# Patient Record
Sex: Male | Born: 1970
Health system: Southern US, Community
[De-identification: ages and names within clinical notes are randomized; demographics above are authoritative.]

## PROBLEM LIST (undated history)

## (undated) DIAGNOSIS — I2510 Atherosclerotic heart disease of native coronary artery without angina pectoris: Secondary | ICD-10-CM

## (undated) DIAGNOSIS — R7303 Prediabetes: Secondary | ICD-10-CM

## (undated) DIAGNOSIS — R454 Irritability and anger: Secondary | ICD-10-CM

---
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
## 2005-06-25 ENCOUNTER — Inpatient Hospital Stay: Payer: Self-pay | Admitting: Unknown Physician Specialty

## 2018-11-07 ENCOUNTER — Emergency Department
Admission: EM | Admit: 2018-11-07 | Discharge: 2018-11-10 | Disposition: A | Discharge status: Home / Self Care | Payer: Self-pay | Attending: Emergency Medicine | Admitting: Emergency Medicine

## 2018-11-07 ENCOUNTER — Encounter: Payer: Self-pay | Admitting: Emergency Medicine

## 2018-11-07 ENCOUNTER — Other Ambulatory Visit: Payer: Self-pay

## 2018-11-07 DIAGNOSIS — F39 Unspecified mood [affective] disorder: Secondary | ICD-10-CM | POA: Insufficient documentation

## 2018-11-07 DIAGNOSIS — F4325 Adjustment disorder with mixed disturbance of emotions and conduct: Secondary | ICD-10-CM

## 2018-11-07 DIAGNOSIS — R45850 Homicidal ideations: Secondary | ICD-10-CM

## 2018-11-07 DIAGNOSIS — F71 Moderate intellectual disabilities: Secondary | ICD-10-CM

## 2018-11-07 DIAGNOSIS — R451 Restlessness and agitation: Secondary | ICD-10-CM | POA: Insufficient documentation

## 2018-11-07 DIAGNOSIS — F79 Unspecified intellectual disabilities: Secondary | ICD-10-CM

## 2018-11-07 HISTORY — DX: Irritability and anger: R45.4

## 2018-11-07 HISTORY — DX: Prediabetes: R73.03

## 2018-11-07 LAB — CBC
HCT: 46 % (ref 39.0–52.0)
Hemoglobin: 15 g/dL (ref 13.0–17.0)
MCH: 30.5 pg (ref 26.0–34.0)
MCHC: 32.6 g/dL (ref 30.0–36.0)
MCV: 93.7 fL (ref 80.0–100.0)
PLATELETS: 326 10*3/uL (ref 150–400)
RBC: 4.91 MIL/uL (ref 4.22–5.81)
RDW: 12.7 % (ref 11.5–15.5)
WBC: 5.5 10*3/uL (ref 4.0–10.5)
nRBC: 0 % (ref 0.0–0.2)

## 2018-11-07 LAB — COMPREHENSIVE METABOLIC PANEL
ALT: 18 U/L (ref 0–44)
AST: 27 U/L (ref 15–41)
Albumin: 4.5 g/dL (ref 3.5–5.0)
Alkaline Phosphatase: 76 U/L (ref 38–126)
Anion gap: 7 (ref 5–15)
BUN: 10 mg/dL (ref 6–20)
CHLORIDE: 107 mmol/L (ref 98–111)
CO2: 27 mmol/L (ref 22–32)
Calcium: 9 mg/dL (ref 8.9–10.3)
Creatinine, Ser: 0.94 mg/dL (ref 0.61–1.24)
GFR calc Af Amer: >60 mL/min (ref >60)
GFR calc non Af Amer: >60 mL/min (ref >60)
Glucose, Bld: 93 mg/dL (ref 70–99)
Potassium: 3.6 mmol/L (ref 3.5–5.1)
Sodium: 141 mmol/L (ref 135–145)
Total Bilirubin: 0.6 mg/dL (ref 0.3–1.2)
Total Protein: 7.6 g/dL (ref 6.5–8.1)

## 2018-11-07 LAB — URINE DRUG SCREEN, QUALITATIVE (ARMC ONLY)
Amphetamines, Ur Screen: NOT DETECTED
BENZODIAZEPINE, UR SCRN: NOT DETECTED
Barbiturates, Ur Screen: NOT DETECTED
Cannabinoid 50 Ng, Ur ~~LOC~~: NOT DETECTED
Cocaine Metabolite,Ur ~~LOC~~: NOT DETECTED
MDMA (Ecstasy)Ur Screen: NOT DETECTED
Methadone Scn, Ur: NOT DETECTED
Opiate, Ur Screen: NOT DETECTED
Phencyclidine (PCP) Ur S: NOT DETECTED
TRICYCLIC, UR SCREEN: NOT DETECTED

## 2018-11-07 LAB — ETHANOL: Alcohol, Ethyl (B): <10 mg/dL (ref <10)

## 2018-11-07 LAB — ACETAMINOPHEN LEVEL

## 2018-11-07 LAB — SALICYLATE LEVEL

## 2018-11-07 NOTE — ED Notes (Signed)
Pt. Finished talking with SOC.  Pt. Requested remote for tv.  Pt. Watching tv at this time, pt. Is calm and cooperative at this time.
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
## 2018-11-07 NOTE — ED Notes (Addendum)
Pt's Belongings: Pepper spray Plad button up Sneakers White socks Jeans with belt [employer] wallet Blue boxers Blue and white jacket [employer] phone

## 2018-11-07 NOTE — ED Notes (Signed)
[phone]

## 2018-11-07 NOTE — BH Assessment (Signed)
Writer updated the patient's sister ([phone]) [facility] and to expect a phone call from them.

## 2018-11-07 NOTE — ED Notes (Signed)
Patient's sister Misty StanleyLisa:  [phone]  *best contact*  Father: [phone]

## 2018-11-07 NOTE — ED Notes (Signed)
Pt making a phone call.  Maintained on 15 minute checks and observation by security for safety. 

## 2018-11-07 NOTE — ED Notes (Signed)
SOC called back and recommend admission and to start back on meds (Prn at first).

## 2018-11-08 NOTE — ED Provider Notes (Signed)
I spoke with TTS who is spoken with the patient's sister with whom he lives, who believes he had more behavioral disturbance secondary to developmental delay and is comfortable taking him home.  I have reconsulted specialist on call for reevaluation for possible disposition.   Governor RooksLord, Zamaya Rapaport, MD 11/08/18 [phone]

## 2018-11-08 NOTE — ED Notes (Signed)
Patient continues to endorse thoughts of harming his sister, brother-in-law, and cousins. He says he would "burn them up." He denies SI and AVH. He is pleasant and cooperative. He denies any needs, concerns, or questions. Pt was encouraged to approach staff with such if needed. He remains safe with checks, rounds, and camera monitoring in place.

## 2018-11-08 NOTE — ED Notes (Signed)
BEHAVIORAL HEALTH ROUNDING Patient sleeping: No. Patient alert and oriented: yes Behavior appropriate: Yes.  ; If no, describe:  Nutrition and fluids offered: yes Toileting and hygiene offered: Yes  Sitter present: q15 minute observations and security monitoring Law enforcement present: Yes    

## 2018-11-08 NOTE — ED Notes (Signed)
Patient is taking a shower.

## 2018-11-08 NOTE — ED Notes (Signed)
Pt. Up using bathroom. 

## 2018-11-08 NOTE — ED Notes (Addendum)
Patient asked to call his sister, when asked him if he was sure he wanted to call his sister, patient said "nevermind" informed patient that it was ok, if he wanted to speak with her, he had told the previous staff he was angry at her and wanted to hurt her. Let patient know if he wanted to talk with her he could. Patient said "ok, ill think about it"

## 2018-11-08 NOTE — ED Notes (Signed)
Patient in room 5, nurse received report from PhelpsJakeda RN, Patient is calm and cooperative, watching tv, denies Si/hi or avh at this time.

## 2018-11-08 NOTE — ED Provider Notes (Signed)
-----------------------------------------   3:30 AM on 11/08/2018 -----------------------------------------   Blood pressure (!) 153/69, pulse 82, temperature 97.6 F (36.4 C), temperature source Oral, resp. rate (!) 22, height 5\' 8"  (1.727 m), weight 86.2 kg, SpO2 99 %.  The patient had no acute events since last update.  Calm and cooperative at this time.  Specialist on-call Dr. Waldron SessionGerz recommends inpatient admission for's stabilization.  Defer standing medications to inpatient team.   Chad Young, Chad Quigley, MD 11/08/18 [phone]

## 2018-11-08 NOTE — BH Assessment (Signed)
TTS received call from patient's sister Misty StanleyLisa at [phone] inquiring about patients status. This Clinical research associatewriter informed sister patient has [facility], however IQ is needed. Sister stated she doesn't know patient IQ score and patient lashes out when he is upset. Writer informed patient, IDD is barrier to inpatient psychiatric placement and she will speak with [facility] about requesting a repeat SOC for possible discharge.   TTS contacted [facility] Lord and informed her of call with patients sister. Dr. Shaune PollackLord stated she will review patients chart and possibly order a new SOC for discharge.

## 2018-11-08 NOTE — ED Notes (Signed)
Patient talking to SOC 

## 2018-11-09 MED ORDER — LORAZEPAM 2 MG PO TABS: 2.0000 mg | ORAL_TABLET | ORAL | Status: DC | PRN

## 2018-11-09 MED ORDER — HALOPERIDOL 5 MG PO TABS: 5.0000 mg | ORAL_TABLET | Freq: Four times a day (QID) | ORAL | Status: DC | PRN

## 2018-11-09 NOTE — ED Notes (Signed)
Called SOC for consult [phone] 

## 2018-11-09 NOTE — BH Assessment (Signed)
Writer received phone call from patient's sister ([phone]), Clinical research associatewriter updated her about patient disposition. Informed her the patient will see psych MD tomorrow (11/10/2018). She requested she be present or received a phone call from him, so she can informed him about the patient. Sister further reports, the family have agreed to let the patient stay in [place] with the father, until they placed him in a Group Home.

## 2018-11-09 NOTE — ED Notes (Signed)
Patient is alert, verbal and ambulatory. Patient is calm and cooperative. Patient observed in room watching television. Patient provided support and encouragement. q 15 minute checks in progress and patient remains safe on unit. Monitoring continues.

## 2018-11-09 NOTE — BH Assessment (Signed)
Writer spoke to patient to complete updated/Reassesment. Patient still stating he still do not want to go home with his sister. Patient denies SI/HI. He's reports that he is upset with is sister, her husband and cousin and his aunt. He denies HI towards his sister and her husband.

## 2018-11-09 NOTE — ED Provider Notes (Signed)
-----------------------------------------   6:43 AM on 11/09/2018 -----------------------------------------   Blood pressure 109/78, pulse 100, temperature 97.9 F (36.6 C), temperature source Oral, resp. rate 17, height 5\' 8"  (1.727 m), weight 86.2 kg, SpO2 100 %.  The patient had no acute events since last update.  Calm and cooperative at this time.  Disposition is pending Psychiatry/Behavioral Medicine team recommendations.     Irean HongSung, Jenavieve Freda J, MD 11/09/18 [phone]

## 2018-11-09 NOTE — ED Notes (Signed)
[facility] rec inpatient 

## 2018-11-09 NOTE — ED Notes (Signed)
Hourly rounding reveals patient sleeping in room. No complaints, stable, in no acute distress. Q15 minute rounds and monitoring via Security Cameras to continue. 

## 2018-11-09 NOTE — ED Notes (Signed)
Hourly rounding reveals patient in room. No complaints, stable, in no acute distress. Q15 minute rounds and monitoring via Security Cameras to continue. 

## 2018-11-09 NOTE — ED Notes (Signed)
Pt given supplies to take a shower.  

## 2018-11-09 NOTE — ED Notes (Signed)
Hourly rounding reveals patient in room. No complaints, stable, in no acute distress. Q15 minute rounds and monitoring via Security Cameras to continue.Snack and beverage given. 

## 2018-11-09 NOTE — ED Notes (Signed)
Pt asleep in rm, breakfast tray placed on chair in rm. 

## 2018-11-09 NOTE — ED Notes (Signed)
Pt given diet coke. 

## 2018-11-09 NOTE — ED Notes (Signed)
Report to include Situation, Background, Assessment, and Recommendations received from Cecile SheererBarbara Baker RN. Patient alert and oriented, warm and dry, in no acute distress. Patient denies SI, AVH and pain. Patient states he thinks about hurting his sister and brother in law. Patient made aware of Q15 minute rounds and security cameras for their safety. Patient instructed to come to me with needs or concerns.

## 2018-11-09 NOTE — ED Notes (Signed)
Dinner Meal given to patient. 

## 2018-11-10 DIAGNOSIS — F4325 Adjustment disorder with mixed disturbance of emotions and conduct: Secondary | ICD-10-CM

## 2018-11-10 DIAGNOSIS — F71 Moderate intellectual disabilities: Secondary | ICD-10-CM

## 2018-11-10 DIAGNOSIS — R45850 Homicidal ideations: Secondary | ICD-10-CM

## 2018-11-10 DIAGNOSIS — F79 Unspecified intellectual disabilities: Secondary | ICD-10-CM

## 2018-11-10 MED ORDER — RISPERIDONE 1 MG PO TABS: 1.0000 mg | ORAL_TABLET | Freq: Two times a day (BID) | ORAL | 2 refills | Status: DC

## 2018-11-10 NOTE — ED Notes (Signed)
Hourly rounding reveals patient in room. No complaints, stable, in no acute distress. Q15 minute rounds and monitoring via Security Cameras to continue. 

## 2018-11-10 NOTE — ED Notes (Signed)
Hourly rounding reveals patient sleeping in room. No complaints, stable, in no acute distress. Q15 minute rounds and monitoring via Security Cameras to continue. 

## 2018-11-10 NOTE — ED Notes (Signed)
Pt's breakfast tray placed at bedside. 

## 2018-11-10 NOTE — ED Provider Notes (Signed)
-----------------------------------------   6:19 AM on 11/10/2018 -----------------------------------------   Blood pressure (!) 148/83, pulse 89, temperature 98.1 F (36.7 C), temperature source Oral, resp. rate 16, height 5\' 8"  (1.727 m), weight 86.2 kg, SpO2 100 %.  The patient had no acute events since last update.  Calm and cooperative at this time.  Disposition is pending Psychiatry/Behavioral Medicine team recommendations.     Irean HongSung,  J, MD 11/10/18 [phone]

## 2018-11-10 NOTE — Consult Note (Signed)
[facility] Face-to-Face Psychiatry Consult   Reason for Consult: Consult for this 47 year old man with intellectual disability who has been in the emergency room for several Referring Physician: Scotty Court Patient Identification: Chad Young MRN:  [id number] Principal Diagnosis: Adjustment disorder with mixed disturbance of emotions and conduct Diagnosis:  Principal Problem:   Adjustment disorder with mixed disturbance of emotions and conduct Active Problems:   Moderate intellectual disability   Homicidal ideation   Total Time spent with patient: 1 hour  Subjective:   Chad Young is a 47 y.o. male patient admitted with "I do not want to go back to [place]".  HPI: This is a 47 year old man with intellectual disability who has been in the emergency room since the 29th.  Evidently he had come down to [place] for the Thanksgiving holiday and while he was here became emotionally distraught and ran away at some point.  Patient came to the emergency room and reported he was having homicidal ideations specifically saying that he wanted to kill his sister and her husband.  His reasons for this were that they were "mean" to him.  He did not report any physical abuse.  Patient clearly has intellectual disability.  Language skills and reasoning skills are obviously impaired.  Patient continues to state to me that he does not want to live with his sister.  Denies any suicidal thoughts.  Denies any psychotic symptoms.  Patient has been calm and not aggressive since being in the emergency room.  Tele-psychiatry service saw the patient and recommended inpatient hospitalization.  Because of his underlying diagnosis he is unlikely to benefit from hospitalization and is not appropriate for our unit.  Medical history: Reportedly has borderline diabetes  Social history: His elderly father is his legal guardian.  Patient however lives with his sister and her family by agreement.  Substance abuse history:  None    Past Psychiatric History: Sister has been contacted and reports that in the past patient's behavior was much calmer when he was taking antipsychotics particularly risk for doll.  She says that he changed doctors recently and they discontinued his medicine.  No history of suicide attempts.  Some aggression but the family does not feel that he is in acute danger to them.  No history of psychosis.  There have been hospitalizations at state facilities in the past apparently.  Risk to Self: Suicidal Ideation: No Suicidal Intent: No Is patient at risk for suicide?: No Suicidal Plan?: No Access to Means: No What has been your use of drugs/alcohol within the last 12 months?: Reports of none How many times?: 0 Other Self Harm Risks: Reports of none Triggers for Past Attempts: None known Intentional Self Injurious Behavior: None Risk to Others: Homicidal Ideation: Yes-Currently Present Thoughts of Harm to Others: Yes-Currently Present Comment - Thoughts of Harm to Others: Currently mad at his sister Current Homicidal Intent: No Current Homicidal Plan: No Access to Homicidal Means: No Identified Victim: Sister and her husband History of harm to others?: No Assessment of Violence: None Noted Violent Behavior Description: None Reported Does patient have access to weapons?: No Criminal Charges Pending?: No Does patient have a court date: No Prior Inpatient Therapy: Prior Inpatient Therapy: Yes Prior Therapy Dates: 06/2005 Prior Therapy Facilty/Provider(s): [facility] Reason for Treatment: Behavioral Problems Prior Outpatient Therapy: Prior Outpatient Therapy: Yes Prior Therapy Dates: Unknown Prior [facility]Provider[facility] Reason for Treatment: IDD and Behvioral Problems Does patient have an ACCT team?: No Does patient have Intensive  In-House Services?  : No Does patient have [facility] services? : No Does patient have P4CC services?: No  Past Medical History:   Past Medical History:  Diagnosis Date  . Borderline diabetes   . Excessive anger    History reviewed. No pertinent surgical history. Family History: No family history on file. Family Psychiatric  History: No family history Social History:  Social History   Substance and Sexual Activity  Alcohol Use Never  . Frequency: Never     Social History   Substance and Sexual Activity  Drug Use Never    Social History   Socioeconomic History  . Marital status: Single    Spouse name: Not on file  . Number of children: Not on file  . Years of education: Not on file  . Highest education level: Not on file  Occupational History  . Not on file  Social Needs  . Financial resource strain: Not on file  . Food insecurity:    Worry: Not on file    Inability: Not on file  . Transportation needs:    Medical: Not on file    Non-medical: Not on file  Tobacco Use  . Smoking status: Never Smoker  . Smokeless tobacco: Never Used  Substance and Sexual Activity  . Alcohol use: Never    Frequency: Never  . Drug use: Never  . Sexual activity: Not on file  Lifestyle  . Physical activity:    Days per week: Not on file    Minutes per session: Not on file  . Stress: Not on file  Relationships  . Social connections:    Talks on phone: Not on file    Gets together: Not on file    Attends religious service: Not on file    Active member of club or organization: Not on file    Attends meetings of clubs or organizations: Not on file    Relationship status: Not on file  Other Topics Concern  . Not on file  Social History Narrative  . Not on file   Additional Social History:    Allergies:   Allergies  Allergen Reactions  . Penicillins     Labs: No results found for this or any previous visit (from the past 48 hour(s)).  No current facility-administered medications for this encounter.    Current Outpatient Medications  Medication Sig Dispense Refill  . risperiDONE (RISPERDAL) 1 MG  tablet Take 1 tablet (1 mg total) by mouth 2 (two) times daily. 60 tablet 2    Musculoskeletal: Strength & Muscle Tone: within normal limits Gait & Station: normal Patient leans: N/A  Psychiatric Specialty Exam: Physical Exam  Nursing note and vitals reviewed. Constitutional: He appears well-developed and well-nourished.  HENT:  Head: Normocephalic and atraumatic.  Eyes: Pupils are equal, round, and reactive to light. Conjunctivae are normal.  Neck: Normal range of motion.  Cardiovascular: Regular rhythm and normal heart sounds.  Respiratory: Effort normal. No respiratory distress.  GI: Soft.  Musculoskeletal: Normal range of motion.  Neurological: He is alert.  Skin: Skin is warm and dry.  Psychiatric: His affect is blunt. His speech is delayed, tangential and slurred. He is not agitated and not aggressive. Thought content is not paranoid. Cognition and memory are impaired. He expresses impulsivity and inappropriate judgment. He expresses homicidal ideation. He expresses no homicidal plans.    Review of Systems  Constitutional: Negative.   HENT: Negative.   Eyes: Negative.   Respiratory: Negative.   Cardiovascular: Negative.  Gastrointestinal: Negative.   Musculoskeletal: Negative.   Skin: Negative.   Neurological: Negative.   Psychiatric/Behavioral: Negative.     Blood pressure 119/78, pulse 100, temperature 97.6 F (36.4 C), temperature source Oral, resp. rate 16, height 5\' 8"  (1.727 m), weight 86.2 kg, SpO2 100 %.Body mass index is 28.89 kg/m.  General Appearance: Casual  Eye Contact:  Minimal  Speech:  Slow and Slurred  Volume:  Decreased  Mood:  Anxious  Affect:  Constricted  Thought Process:  Disorganized  Orientation:  Negative  Thought Content:  Illogical  Suicidal Thoughts:  No  Homicidal Thoughts:  Yes.  without intent/plan  Memory:  Immediate;   Fair Recent;   Poor Remote;   Poor  Judgement:  Impaired  Insight:  Shallow  Psychomotor Activity:   Decreased  Concentration:  Concentration: Fair  Recall:  [employer] of Knowledge:  Fair  Language:  Poor  Akathisia:  No  Handed:  Right  AIMS (if indicated):     Assets:  Desire for Improvement Housing Resilience Social Support  ADL's:  Impaired  Cognition:  Impaired,  Moderate  Sleep:        Treatment Plan Summary: Medication management and Plan 47 year old man with intellectual disability and behavior problems.  Does not meet criteria for inpatient psychiatric hospitalization.  Attempts to find any hospitalization for him over the past 4 days have been a failure.  Patient is calm and at his baseline.  Family is agreeable to picking him up.  Sister requests that we restart Risperdal which was helpful for him in the past.  I am happy to do this and have written a prescription for 1 mg twice a day.  Patient will follow-up with local providers in [place].  Case reviewed with TTS and emergency room physician.  Disposition: No evidence of imminent risk to self or others at present.   Patient does not meet criteria for psychiatric inpatient admission. Supportive therapy provided about ongoing stressors.  Mordecai Rasmussen, MD 11/10/2018 10:45 PM

## 2018-11-10 NOTE — ED Provider Notes (Signed)
 -----------------------------------------   3:45 PM on 11/10/2018 -----------------------------------------  Case discussed with Dr. Toni Amendlapacs after his evaluation of the patient.  No acute psychiatric or medical needs.  Provided a prescription for Risperdal by Dr. Toni Amendlapacs to help control some of his agitation symptoms.  Sister from [place] is agreeable to take the patient home.   Sharman CheekStafford, Hedy Garro, MD 11/10/18 [phone]

## 2018-11-10 NOTE — ED Notes (Signed)
Called pt's sister to let her know that pt has been discharged.  Pt's sister stated that she would contact pt's father to have him come pick pt up.

## 2018-11-10 NOTE — ED Notes (Signed)
PT VOLUNTARY PENDING PLACEMENT. 

## 2018-11-10 NOTE — Consult Note (Signed)
Psychiatry: Patient seen chart reviewed.  Brief note full note to follow.  47 year old man with intellectual disability probably of the a moderate degree.  Patient does not appear to be otherwise mentally ill or to be at acutely elevated risk of dangerousness.  Not appropriate for inpatient psychiatric treatment.  Family has been contacted on our agreeable to taking him home.  Case reviewed with emergency room physician.

## 2019-08-06 ENCOUNTER — Emergency Department
Admission: EM | Admit: 2019-08-06 | Discharge: 2019-08-07 | Disposition: A | Discharge status: Home / Self Care | Payer: Medicare Other | Attending: Internal Medicine | Admitting: Internal Medicine

## 2019-08-06 ENCOUNTER — Encounter: Payer: Self-pay | Admitting: Emergency Medicine

## 2019-08-06 ENCOUNTER — Other Ambulatory Visit: Payer: Self-pay

## 2019-08-06 DIAGNOSIS — F79 Unspecified intellectual disabilities: Secondary | ICD-10-CM | POA: Insufficient documentation

## 2019-08-06 DIAGNOSIS — R456 Violent behavior: Secondary | ICD-10-CM | POA: Insufficient documentation

## 2019-08-06 DIAGNOSIS — Z20828 Contact with and (suspected) exposure to other viral communicable diseases: Secondary | ICD-10-CM | POA: Insufficient documentation

## 2019-08-06 DIAGNOSIS — F3161 Bipolar disorder, current episode mixed, mild: Secondary | ICD-10-CM

## 2019-08-06 DIAGNOSIS — F3160 Bipolar disorder, current episode mixed, unspecified: Secondary | ICD-10-CM | POA: Diagnosis present

## 2019-08-06 DIAGNOSIS — Z046 Encounter for general psychiatric examination, requested by authority: Secondary | ICD-10-CM | POA: Diagnosis present

## 2019-08-06 DIAGNOSIS — I259 Chronic ischemic heart disease, unspecified: Secondary | ICD-10-CM | POA: Insufficient documentation

## 2019-08-06 DIAGNOSIS — R451 Restlessness and agitation: Secondary | ICD-10-CM | POA: Insufficient documentation

## 2019-08-06 DIAGNOSIS — R4689 Other symptoms and signs involving appearance and behavior: Secondary | ICD-10-CM

## 2019-08-06 DIAGNOSIS — Z03818 Encounter for observation for suspected exposure to other biological agents ruled out: Secondary | ICD-10-CM | POA: Diagnosis not present

## 2019-08-06 HISTORY — DX: Atherosclerotic heart disease of native coronary artery without angina pectoris: I25.10

## 2019-08-06 LAB — CBC
HCT: 49.4 % (ref 39.0–52.0)
Hemoglobin: 16.2 g/dL (ref 13.0–17.0)
MCH: 30.8 pg (ref 26.0–34.0)
MCHC: 32.8 g/dL (ref 30.0–36.0)
MCV: 93.9 fL (ref 80.0–100.0)
Platelets: 316 10*3/uL (ref 150–400)
RBC: 5.26 MIL/uL (ref 4.22–5.81)
RDW: 12.4 % (ref 11.5–15.5)
WBC: 5.4 10*3/uL (ref 4.0–10.5)
nRBC: 0 % (ref 0.0–0.2)

## 2019-08-06 LAB — COMPREHENSIVE METABOLIC PANEL
ALT: 19 U/L (ref 0–44)
AST: 30 U/L (ref 15–41)
Albumin: 4.3 g/dL (ref 3.5–5.0)
Alkaline Phosphatase: 74 U/L (ref 38–126)
Anion gap: 9 (ref 5–15)
BUN: 13 mg/dL (ref 6–20)
CO2: 26 mmol/L (ref 22–32)
Calcium: 9.4 mg/dL (ref 8.9–10.3)
Chloride: 106 mmol/L (ref 98–111)
Creatinine, Ser: 1.18 mg/dL (ref 0.61–1.24)
GFR calc Af Amer: >60 mL/min (ref >60)
GFR calc non Af Amer: >60 mL/min (ref >60)
Glucose, Bld: 166 mg/dL — ABNORMAL HIGH (ref 70–99)
Potassium: 4 mmol/L (ref 3.5–5.1)
Sodium: 141 mmol/L (ref 135–145)
Total Bilirubin: 0.7 mg/dL (ref 0.3–1.2)
Total Protein: 7.6 g/dL (ref 6.5–8.1)

## 2019-08-06 LAB — URINE DRUG SCREEN, QUALITATIVE (ARMC ONLY)
Amphetamines, Ur Screen: NOT DETECTED
Barbiturates, Ur Screen: NOT DETECTED
Benzodiazepine, Ur Scrn: NOT DETECTED
Cannabinoid 50 Ng, Ur ~~LOC~~: NOT DETECTED
Cocaine Metabolite,Ur ~~LOC~~: NOT DETECTED
MDMA (Ecstasy)Ur Screen: NOT DETECTED
Methadone Scn, Ur: NOT DETECTED
Opiate, Ur Screen: NOT DETECTED
Phencyclidine (PCP) Ur S: NOT DETECTED
Tricyclic, Ur Screen: NOT DETECTED

## 2019-08-06 LAB — SALICYLATE LEVEL: Salicylate Lvl: <7 mg/dL (ref 2.8–30.0)

## 2019-08-06 LAB — ACETAMINOPHEN LEVEL: Acetaminophen (Tylenol), Serum: <10 ug/mL — ABNORMAL LOW (ref 10–30)

## 2019-08-06 LAB — ETHANOL: Alcohol, Ethyl (B): <10 mg/dL (ref <10)

## 2019-08-06 NOTE — ED Triage Notes (Signed)
Pt in via POV; sister, [name] who holds guardianship is present for triage as well.  Pt reports agitation and aggressiveness over the last week.  Pt denies SI/HI.  Pt calm, cooperative in triage, NAD noted at this time.

## 2019-08-06 NOTE — ED Notes (Signed)
Patient walking TV. Patient given PO fluids

## 2019-08-06 NOTE — ED Provider Notes (Signed)
[facility] Emergency Department Provider Note       Time seen: ----------------------------------------- 5:14 PM on 08/06/2019 -----------------------------------------   I have reviewed the triage vital signs and the nursing notes.  HISTORY   Chief Complaint Psychiatric Evaluation    HPI Chad Young is a 48 y.o. male with a history of coronary artery disease who presents to the ED for aggressive behavior and agitation.  Patient arrived by private vehicle, patient's sister who holds guardianship reports agitation and aggressiveness over the past 7 days.  Patient denies suicidal or homicidal ideations.  He arrives, cooperative, denies any other complaints.  Past Medical History:  Diagnosis Date  . Coronary artery disease     There are no active problems to display for this patient.   History reviewed. No pertinent surgical history.  Allergies Patient has no known allergies.  Social History Social History   Tobacco Use  . Smoking status: Never Smoker  . Smokeless tobacco: Never Used  Substance Use Topics  . Alcohol use: Not Currently  . Drug use: Not Currently   Review of Systems Constitutional: Negative for fever. Cardiovascular: Negative for chest pain. Respiratory: Negative for shortness of breath. Gastrointestinal: Negative for abdominal pain, vomiting and diarrhea. Musculoskeletal: Negative for back pain. Skin: Negative for rash. Neurological: Negative for headaches, focal weakness or numbness. Psychiatric: Positive for aggressive behavior and agitation  All systems negative/normal/unremarkable except as stated in the HPI  ____________________________________________   PHYSICAL EXAM:  VITAL SIGNS: ED Triage Vitals  Enc Vitals Group     BP 08/06/19 1656 139/70     Pulse Rate 08/06/19 1656 93     Resp 08/06/19 1656 16     Temp 08/06/19 1656 98.6 F (37 C)     Temp Source 08/06/19 1656 Oral     SpO2 08/06/19 1656 100 %      Weight 08/06/19 1657 190 lb (86.2 kg)     Height 08/06/19 1657 5\' 8"  (1.727 m)     Head Circumference --      Peak Flow --      Pain Score 08/06/19 1657 0     Pain Loc --      Pain Edu? --      Excl. in GC? --    Constitutional: Alert and oriented. Well appearing and in no distress. Eyes: Conjunctivae are normal. Normal extraocular movements. Cardiovascular: Normal rate, regular rhythm. No murmurs, rubs, or gallops. Respiratory: Normal respiratory effort without tachypnea nor retractions. Breath sounds are clear and equal bilaterally. No wheezes/rales/rhonchi. Gastrointestinal: Soft and nontender. Normal bowel sounds Musculoskeletal: Nontender with normal range of motion in extremities. No lower extremity tenderness nor edema. Neurologic:  Normal speech and language. No gross focal neurologic deficits are appreciated.  Skin:  Skin is warm, dry and intact. No rash noted. Psychiatric: Mood and affect are normal. Speech and behavior are normal.  ____________________________________________  ED COURSE:  As part of my medical decision making, I reviewed the following data within the electronic MEDICAL RECORD NUMBER History obtained from family if available, nursing notes, old chart and ekg, as well as notes from prior ED visits. Patient presented for aggressive behavior and agitation, we will assess with labs and imaging as indicated at this time.   Procedures  Chad Young was evaluated in Emergency Department on 08/06/2019 for the symptoms described in the history of present illness. He was evaluated in the context of the global COVID-19 pandemic, which necessitated consideration that the patient might be  at risk for infection with the SARS-CoV-2 virus that causes COVID-19. Institutional protocols and algorithms that pertain to the evaluation of patients at risk for COVID-19 are in a state of rapid change based on information released by regulatory bodies including the CDC and federal and  state organizations. These policies and algorithms were followed during the patient's care in the ED.  ____________________________________________   LABS (pertinent positives/negatives)  Labs Reviewed  COMPREHENSIVE METABOLIC PANEL - Abnormal; Notable for the following components:      Result Value   Glucose, Bld 166 (*)    All other components within normal limits  ACETAMINOPHEN LEVEL - Abnormal; Notable for the following components:   Acetaminophen (Tylenol), Serum <10 (*)    All other components within normal limits  ETHANOL  SALICYLATE LEVEL  CBC  URINE DRUG SCREEN, QUALITATIVE ([facility] ONLY)   ____________________________________________   DIFFERENTIAL DIAGNOSIS   Aggression, medication noncompliance, occult infection  FINAL ASSESSMENT AND PLAN  Aggressive behavior, agitation   Plan: The patient had presented for aggressive behavior and agitation. Patient's labs are unremarkable, he appears medically clear for psychiatric evaluation and disposition.Laurence Aly, MD    Note: This note was generated in part or whole with voice recognition software. Voice recognition is usually quite accurate but there are transcription errors that can and very often do occur. I apologize for any typographical errors that were not detected and corrected.     Earleen Newport, MD 08/06/19 [phone]

## 2019-08-06 NOTE — ED Notes (Signed)
Report to include Situation, Background, Assessment, and Recommendations received from [facility]. Patient alert and oriented, warm and dry, in no acute distress. Patient denies SI, HI, AVH and pain. Patient made aware of Q15 minute rounds and security cameras for their safety. Patient instructed to come to me with needs or concerns.

## 2019-08-06 NOTE — BH Assessment (Addendum)
Assessment Note  Chad Young is an 48 y.o. male.  The pt came in due to increased agitation.  The pt has been arguing with people, getting in people's personal space.  The pt denies hitting people, but stated that he thought about hitting the people.  The pt denies SI, HI and psychosis.  According to the pt's sister, the pt has a past diagnosis of IDD.  The pt denies any previous hospitalizations.  He isn't seeing a psychiatrist currently.  He has been living at his group home for the past 2 weeks.  The pt denies SI, self harm, HI, legal issues, history of abuse and hallucinations.  He stated he is sleeping and eating well.  The pt denies SA.  His guardian is his sister, Chad Young.  The owner of the group home is [name] [phone].  Pt is dressed in scrubs. He is alert and oriented x4. Pt speaks in a clear tone, at moderate volume and normal pace. Eye contact is good. Pt's mood is pleasant. Thought process is coherent and relevant. There is no indication Pt is currently responding to internal stimuli or experiencing delusional thought content.?Pt was cooperative throughout assessment.    Diagnosis:   Past Medical History:  Past Medical History:  Diagnosis Date  . Coronary artery disease     History reviewed. No pertinent surgical history.  Family History: No family history on file.  Social History:  reports that he has never smoked. He has never used smokeless tobacco. He reports previous alcohol use. He reports previous drug use.  Additional Social History:  Alcohol / Drug Use Pain Medications: See MAR Prescriptions: See MAR Over the Counter: See MAR History of alcohol / drug use?: No history of alcohol / drug abuse Longest period of sobriety (when/how long): NA  CIWA: CIWA-Ar BP: (!) 144/80 Pulse Rate: 90 COWS:    Allergies: No Known Allergies  Home Medications: (Not in a hospital admission)   OB/GYN Status:  No LMP for male patient.  General Assessment  Data Location of Assessment: [facility] ED TTS Assessment: In system Is this a Tele or Face-to-Face Assessment?: Face-to-Face Is this an Initial Assessment or a Re-assessment for this encounter?: Initial Assessment Patient Accompanied by:: N/A Language Other than English: No Living Arrangements: Other (Comment)(group home) What gender do you identify as?: Male Marital status: Single Living Arrangements: Group Home Can pt return to current living arrangement?: Yes Admission Status: Voluntary Is patient capable of signing voluntary admission?: Yes Referral Source: Other(group home) Insurance type: [employer]     Crisis Care Plan Living Arrangements: Group Home Legal Guardian: (sister) Name of Psychiatrist: unknown Name of Therapist: unknown  Education Status Is patient currently in school?: No Is the patient employed, unemployed or receiving disability?: Receiving disability income  Risk to self with the past 6 months Suicidal Ideation: No Has patient been a risk to self within the past 6 months prior to admission? : No Suicidal Intent: No Has patient had any suicidal intent within the past 6 months prior to admission? : No Is patient at risk for suicide?: No Suicidal Plan?: No Has patient had any suicidal plan within the past 6 months prior to admission? : No Access to Means: No What has been your use of drugs/alcohol within the last 12 months?: Pt denies Previous Attempts/Gestures: No How many times?: 0 Other Self Harm Risks: pt denies Triggers for Past Attempts: None known Intentional Self Injurious Behavior: None Family Suicide History: No Recent stressful life  event(s): Conflict (Comment)(argument with another resident) Persecutory voices/beliefs?: No Depression: No Substance abuse history and/or treatment for substance abuse?: No Suicide prevention information given to non-admitted patients: Not applicable  Risk to Others within the past 6 months Homicidal  Ideation: No Does patient have any lifetime risk of violence toward others beyond the six months prior to admission? : Yes (comment)(the pt became angry and was thinking about hiting someone) Thoughts of Harm to Others: No Current Homicidal Intent: No Current Homicidal Plan: No Access to Homicidal Means: No Identified Victim: pt denies History of harm to others?: No Assessment of Violence: None Noted Violent Behavior Description: pt denies Does patient have access to weapons?: No Criminal Charges Pending?: No Does patient have a court date: No Is patient on probation?: No  Psychosis Hallucinations: None noted Delusions: None noted  Mental Status Report Appearance/Hygiene: In scrubs, Unremarkable Eye Contact: Fair Motor Activity: Freedom of movement, Unremarkable Speech: Logical/coherent Level of Consciousness: Alert Mood: Pleasant Affect: Appropriate to circumstance Anxiety Level: None Thought Processes: Coherent, Relevant Judgement: Partial Orientation: Person, Place, Time, Situation Obsessive Compulsive Thoughts/Behaviors: None  Cognitive Functioning Concentration: Normal Memory: Recent Intact, Remote Intact Is patient IDD: (unknown) Insight: Fair Impulse Control: Fair Appetite: Good Have you had any weight changes? : No Change Sleep: No Change Total Hours of Sleep: 8 Vegetative Symptoms: None  ADLScreening [facility] Assessment Services) Patient's cognitive ability adequate to safely complete daily activities?: Yes Patient able to express need for assistance with ADLs?: Yes Independently performs ADLs?: Yes (appropriate for developmental age)  Prior Inpatient Therapy Prior Inpatient Therapy: No  Prior Outpatient Therapy Prior Outpatient Therapy: Yes Prior Therapy Dates: current Prior Therapy Facilty/Provider(s): pt doesn't know the name Reason for Treatment: unknown Does patient have an ACCT team?: No Does patient have Intensive In-House Services?  : No Does  patient have [facility] services? : No Does patient have P4CC services?: No  ADL Screening (condition at time of admission) Patient's cognitive ability adequate to safely complete daily activities?: Yes Patient able to express need for assistance with ADLs?: Yes Independently performs ADLs?: Yes (appropriate for developmental age)       Abuse/Neglect Assessment (Assessment to be complete while patient is alone) Abuse/Neglect Assessment Can Be Completed: Yes Physical Abuse: Denies Verbal Abuse: Denies Sexual Abuse: Denies Exploitation of patient/patient's resources: Denies Self-Neglect: Denies Values / Beliefs Cultural Requests During Hospitalization: None Spiritual Requests During Hospitalization: None Consults Spiritual Care Consult Needed: No Social Work Consult Needed: No Regulatory affairs officer (For Healthcare) Does Patient Have a Medical Advance Directive?: No Would patient like information on creating a medical advance directive?: No - Patient declined          Disposition:  Disposition Initial Assessment Completed for this Encounter: Yes  On Site Evaluation by:   Reviewed with Physician:    Enzo Montgomery 08/06/2019 9:25 PM

## 2019-08-07 ENCOUNTER — Encounter: Payer: Self-pay | Admitting: Behavioral Health

## 2019-08-07 DIAGNOSIS — F3161 Bipolar disorder, current episode mixed, mild: Secondary | ICD-10-CM

## 2019-08-07 DIAGNOSIS — R4689 Other symptoms and signs involving appearance and behavior: Secondary | ICD-10-CM | POA: Diagnosis present

## 2019-08-07 DIAGNOSIS — F3160 Bipolar disorder, current episode mixed, unspecified: Secondary | ICD-10-CM | POA: Diagnosis present

## 2019-08-07 LAB — SARS CORONAVIRUS 2 (TAT 6-24 HRS): SARS Coronavirus 2: NEGATIVE

## 2019-08-07 MED ORDER — DIVALPROEX SODIUM 500 MG PO DR TAB
500.0000 mg | DELAYED_RELEASE_TABLET | Freq: Two times a day (BID) | ORAL | Status: DC
  Administered 2019-08-07: 14:00:00 500 mg via ORAL
  Filled 2019-08-07: qty 1

## 2019-08-07 MED ORDER — DIVALPROEX SODIUM 500 MG PO DR TAB: 500.0000 mg | DELAYED_RELEASE_TABLET | Freq: Two times a day (BID) | ORAL | 0 refills | Status: DC

## 2019-08-07 NOTE — ED Notes (Signed)
Pt taking a shower at this time 

## 2019-08-07 NOTE — ED Notes (Signed)
Hourly rounding reveals patient in room. No complaints, stable, in no acute distress. Q15 minute rounds and monitoring via Security Cameras to continue. 

## 2019-08-07 NOTE — ED Notes (Signed)
Pt discharged back to group home. Legal guardian made aware and accepting of discharge. VS stable. All belongings returned to patient. Pt denies SI/HI. Discharge instructions and prescription reviewed with patient.

## 2019-08-07 NOTE — BH Assessment (Signed)
TTS spoke with pt's sister [name]: [phone]) and informed her that pt has been medically and psychiatrically cleared for discharge. She also provided the name and number of pt's group home ([facility]: [phone]) to arrange transportation.  Group home owner Caryl Ada) informed this Probation officer she will be here in 20 minutes to p/u pt upon discharge.

## 2019-08-07 NOTE — Discharge Instructions (Signed)
[facility] Health & [facility] Use [facility]) & [facility] ° °Mental health service in [place], [place] °Address: [address], [place], [place] [address] ° °Phone: [phone] °

## 2019-08-07 NOTE — ED Provider Notes (Signed)
-----------------------------------------   6:52 AM on 08/07/2019 -----------------------------------------   Blood pressure (!) 144/80, pulse 90, temperature 98.4 F (36.9 C), temperature source Oral, resp. rate 18, height 1.727 m (5\' 8" ), weight 86.2 kg, SpO2 100 %.  The patient is calm and cooperative at this time.  There have been no acute events since the last update.  Awaiting disposition plan from Behavioral Medicine and/or Social Work team(s).   Hinda Kehr, MD 08/07/19 [phone]

## 2019-08-07 NOTE — Consult Note (Signed)
[facility] Psych ED Discharge  08/07/2019 1:46 PM Chad Young  MRN:  [id number] Principal Problem: Bipolar affective disorder, current episode mixed [facility]) Discharge Diagnoses: Principal Problem:   Bipolar affective disorder, current episode mixed ([place]) Active Problems:   Aggression  Subjective: "I want my own place, tired of the group home."  Denies suicidal/homicidal ideations, hallucinations, and substance abuse.  Patient seen and evaluated by this provider face-face.  Calm and cooperative since last night.  Past history of bipolar disorder but no medications.  His group home and his guardian are agreeable for him to return as lon as he has a mood stabilizer on board.  Depakote 500 mg BID started to assist and follow up with outpatient provider.  Stable for discharge, Dr Dwyane Dee reviewed this client and concurs with the findings.    Per Caroline Sauger (below): Chad Young is a 48 y.o. male patient presented to [facility] ED via law enforcement under involuntary commitment status (IVC).The patient denies hitting people but stated that I would hit them if "they keep getting in my face."  The patient was seen face-to-face by this provider; chart reviewed and consulted with Dr. Jimmye Norman on 08/06/2019 due to the care of the patient. It was discussed with the EDP that the patient does not meet the criteria to be admitted to the inpatient unit.  Per the group-home owner who voiced if the patient can be placed on a medication to help stabilize his mood, he will be accepted back to the facility.  On evaluation, the patient is alert and oriented x 2-3, calm, cooperative, and mood-congruent with affect, initially. The patient began getting anxious, requesting to be discharged back to his group home. On several occasions, he had to be redirected and discussed with him that he might be discharged in the a.m. The patient does not appear to be responding to internal or external stimuli. Neither is the patient  presenting with any delusional thinking. The patient denies auditory or visual hallucinations. The patient denies suicidal, homicidal, or self-harm ideations. The patient is not presenting with any psychotic or paranoid behaviors. During an encounter with the patient, he was able to answer some questions appropriately. Collateral was obtained by sister Ms. [name] who expresses concerns about the patient's erratic behaviors. The patient was brought in due to him experiencing increased agitation. Per his sister, who disclosed that the patient has been arguing with people, getting in people's personal space.  She stated the patient was brought to the hospital last November and was placed on the medication, unsure of the name.  At that time, she said his Medicaid/Medicare had lapse and the medication cost $200, and the family was unable to afford the medication.  She shared that the patient has been to his group home for about a week to a week and a half. The patient's sister, Ms. [name] [phone]), discussed that she had been caring for her brother for over six years.  She disclosed that the patient needs to be on some medication to help with his mood swings.  When they moved to [place], she stated he was seeing a provider at the health department who decided that he did not need to be on medications. She disclosed that he did well for a while; lately, he has been increasingly experiencing erratic  mood changes. She stated his moods have been so erratic that she is unable to provide care to him anymore. She indicated from birth; he has been diagnosed with IDD.  She believes he is bipolar now. She states she has an aunt who is bipolar.    Plan: The patient needs to be on some medication to assist with his mood. Per his group-home, that is the requirement to accept him back. The patient is not a safety risk to self or others and does not require psychiatric inpatient admission for stabilization and  treatment.  HPI: Per Dr. Mayford Knife; Chad Young a 48 y.o.malewith a history of coronary artery diseasewho presents to the ED for aggressive behavior and agitation. Patient arrived by private vehicle, patient's sister who holds guardianship reports agitation and aggressiveness over the past 7 days. Patient denies suicidal or homicidal ideations. He arrives, cooperative, denies any other complaints.  Total Time spent with patient: 1.5 hours  Past Psychiatric History: bipolar d/o  Past Medical History:  Past Medical History:  Diagnosis Date  . Coronary artery disease    History reviewed. No pertinent surgical history. Family History: History reviewed. No pertinent family history. Family Psychiatric  History: none Social History:  Social History   Substance and Sexual Activity  Alcohol Use Not Currently     Social History   Substance and Sexual Activity  Drug Use Not Currently    Social History   Socioeconomic History  . Marital status: Unknown    Spouse name: Not on file  . Number of children: Not on file  . Years of education: Not on file  . Highest education level: Not on file  Occupational History  . Not on file  Social Needs  . Financial resource strain: Not on file  . Food insecurity    Worry: Not on file    Inability: Not on file  . Transportation needs    Medical: Not on file    Non-medical: Not on file  Tobacco Use  . Smoking status: Never Smoker  . Smokeless tobacco: Never Used  Substance and Sexual Activity  . Alcohol use: Not Currently  . Drug use: Not Currently  . Sexual activity: Not on file  Lifestyle  . Physical activity    Days per week: Not on file    Minutes per session: Not on file  . Stress: Not on file  Relationships  . Social Musician on phone: Not on file    Gets together: Not on file    Attends religious service: Not on file    Active member of club or organization: Not on file    Attends meetings of clubs or  organizations: Not on file    Relationship status: Not on file  Other Topics Concern  . Not on file  Social History Narrative  . Not on file    Has this patient used any form of tobacco in the last 30 days? (Cigarettes, Smokeless Tobacco, Cigars, and/or Pipes) None  Current Medications: Current Facility-Administered Medications  Medication Dose Route Frequency Provider Last Rate Last Dose  . divalproex (DEPAKOTE) DR tablet 500 mg  500 mg Oral Q12H Charm Rings, NP   500 mg at 08/07/19 1346   No current outpatient medications on file.   PTA Medications: (Not in a hospital admission)   Musculoskeletal: Strength & Muscle Tone: within normal limits Gait & Station: normal Patient leans: N/A  Psychiatric Specialty Exam: Physical Exam  Nursing note and vitals reviewed. Constitutional: He is oriented to person, place, and time. He appears well-developed and well-nourished.  HENT:  Head: Normocephalic.  Neck: Normal range of motion.  Respiratory: Effort normal.  Musculoskeletal: Normal range of motion.  Neurological: He is alert and oriented to person, place, and time.  Psychiatric: His speech is normal and behavior is normal. Thought content normal. His mood appears anxious. His affect is blunt. Cognition and memory are impaired. He expresses impulsivity.    Review of Systems  Psychiatric/Behavioral: The patient is nervous/anxious.   All other systems reviewed and are negative.   Blood pressure 116/73, pulse 79, temperature 98.4 F (36.9 C), temperature source Oral, resp. rate 18, height 5\' 8"  (1.727 m), weight 86.2 kg, SpO2 99 %.Body mass index is 28.89 kg/m.  General Appearance: Casual  Eye Contact:  Good  Speech:  Normal Rate  Volume:  Normal  Mood:  Anxious, mild  Affect:  Blunt  Thought Process:  Coherent and Descriptions of Associations: Intact  Orientation:  Full (Time, Place, and Person)  Thought Content:  WDL and Logical  Suicidal Thoughts:  No  Homicidal  Thoughts:  No  Memory:  Immediate;   Fair Recent;   Fair Remote;   Fair  Judgement:  Fair  Insight:  Fair  Psychomotor Activity:  Normal  Concentration:  Concentration: Fair and Attention Span: Fair  Recall:  [employer]Fair  Fund of Knowledge:  Fair  Language:  Good  Akathisia:  No  Handed:  Right  AIMS (if indicated):     Assets:  Housing Leisure Time Physical Health Resilience Social Support  ADL's:  Intact  Cognition:  Impaired,  Mild  Sleep:        Demographic Factors:  Male  Loss Factors: NA  Historical Factors: Impulsivity  Risk Reduction Factors:   Sense of responsibility to family, Living with another person, especially a relative, Positive social support and Positive therapeutic relationship  Continued Clinical Symptoms:  Anxiety, mild  Cognitive Features That Contribute To Risk:  None    Suicide Risk:  Minimal: No identifiable suicidal ideation.  Patients presenting with no risk factors but with morbid ruminations; may be classified as minimal risk based on the severity of the depressive symptoms   Plan Of Care/Follow-up recommendations:  Bipolar affective disorder, mixed: -Started Depakote 500 mg BID for mood and agitation -Follow up with outpatient provider -[facility] information provided  Activity:  as tolerated Diet:  heart healthy diet  Disposition: discharge to group home Nanine MeansJamison , NP 08/07/2019, 1:46 PM

## 2019-08-07 NOTE — ED Notes (Signed)
Pt given phone to use 

## 2019-08-07 NOTE — Consult Note (Signed)
[facility] Face-to-Face Psychiatry Consult   Reason for Consult: Aggressive behavior Referring Physician: Dr. Mayford Knife Patient Identification: Chad Young MRN:  [id number] Principal Diagnosis: Aggression Diagnosis:  Principal Problem:   Aggression   Total Time spent with patient: 1 hour  Subjective: " I did not touch nobody." Chad Young is a 48 y.o. male patient presented to [facility] ED via law enforcement under involuntary commitment status (IVC).The patient denies hitting people but stated that I would hit them if "they keep getting in my face."  The patient was seen face-to-face by this provider; chart reviewed and consulted with Dr. Mayford Knife on 08/06/2019 due to the care of the patient. It was discussed with the EDP that the patient does not meet the criteria to be admitted to the inpatient unit.  Per the group-home owner who voiced if the patient can be placed on a medication to help stabilize his mood, he will be accepted back to the facility.  On evaluation, the patient is alert and oriented x 2-3, calm, cooperative, and mood-congruent with affect, initially. The patient began getting anxious, requesting to be discharged back to his group home. On several occasions, he had to be redirected and discussed with him that he might be discharged in the a.m. The patient does not appear to be responding to internal or external stimuli. Neither is the patient presenting with any delusional thinking. The patient denies auditory or visual hallucinations. The patient denies suicidal, homicidal, or self-harm ideations. The patient is not presenting with any psychotic or paranoid behaviors. During an encounter with the patient, he was able to answer some questions appropriately. Collateral was obtained by sister Ms. Chad Young who expresses concerns about the patient's erratic behaviors. The patient was brought in due to him experiencing increased agitation. Per his sister, who disclosed that the patient has  been arguing with people, getting in people's personal space.  She stated the patient was brought to the hospital last November and was placed on the medication, unsure of the name.  At that time, she said his Medicaid/Medicare had lapse and the medication cost $200, and the family was unable to afford the medication.  She shared that the patient has been to his group home for about a week to a week and a half. The patient's sister, Ms. Chad Young [phone]), discussed that she had been caring for her brother for over six years.  She disclosed that the patient needs to be on some medication to help with his mood swings.  When they moved to [place], she stated he was seeing a provider at the health department who decided that he did not need to be on medications. She disclosed that he did well for a while; lately, he has been increasingly experiencing erratic  mood changes. She stated his moods have been so erratic that she is unable to provide care to him anymore. She indicated from birth; he has been diagnosed with IDD. She believes he is bipolar now. She states she has an aunt who is bipolar.    Plan: The patient needs to be on some medication to assist with his mood. Per his group-home, that is the requirement to accept him back. The patient is not a safety risk to self or others and does not require psychiatric inpatient admission for stabilization and treatment. HPI: Per Dr. Mayford Knife; Chad Young is a 48 y.o. male with a history of coronary artery disease who presents to the ED for aggressive behavior and agitation.  Patient arrived by private vehicle, patient's sister who holds guardianship reports agitation and aggressiveness over the past 7 days.  Patient denies suicidal or homicidal ideations.  He arrives, cooperative, denies any other complaints.  Past Psychiatric History:   Risk to Self: Suicidal Ideation: No Suicidal Intent: No Is patient at risk for suicide?: No Suicidal Plan?:  No Access to Means: No What has been your use of drugs/alcohol within the last 12 months?: Pt denies How many times?: 0 Other Self Harm Risks: pt denies Triggers for Past Attempts: None known Intentional Self Injurious Behavior: None Risk to Others: Homicidal Ideation: No Thoughts of Harm to Others: No Current Homicidal Intent: No Current Homicidal Plan: No Access to Homicidal Means: No Identified Victim: pt denies History of harm to others?: No Assessment of Violence: None Noted Violent Behavior Description: pt denies Does patient have access to weapons?: No Criminal Charges Pending?: No Does patient have a court date: No Prior Inpatient Therapy: Prior Inpatient Therapy: No Prior Outpatient Therapy: Prior Outpatient Therapy: Yes Prior Therapy Dates: current Prior Therapy Facilty/Provider(s): pt doesn't know the name Reason for Treatment: unknown Does patient have an ACCT team?: No Does patient have Intensive In-House Services?  : No Does patient have [facility] services? : No Does patient have P4CC services?: No  Past Medical History:  Past Medical History:  Diagnosis Date  . Coronary artery disease    History reviewed. No pertinent surgical history. Family History: History reviewed. No pertinent family history. Family Psychiatric  History: Maternal aunt bipolar Social History:  Social History   Substance and Sexual Activity  Alcohol Use Not Currently     Social History   Substance and Sexual Activity  Drug Use Not Currently    Social History   Socioeconomic History  . Marital status: Unknown    Spouse name: Not on file  . Number of children: Not on file  . Years of education: Not on file  . Highest education level: Not on file  Occupational History  . Not on file  Social Needs  . Financial resource strain: Not on file  . Food insecurity    Worry: Not on file    Inability: Not on file  . Transportation needs    Medical: Not on file    Non-medical: Not on  file  Tobacco Use  . Smoking status: Never Smoker  . Smokeless tobacco: Never Used  Substance and Sexual Activity  . Alcohol use: Not Currently  . Drug use: Not Currently  . Sexual activity: Not on file  Lifestyle  . Physical activity    Days per week: Not on file    Minutes per session: Not on file  . Stress: Not on file  Relationships  . Social Musicianconnections    Talks on phone: Not on file    Gets together: Not on file    Attends religious service: Not on file    Active member of club or organization: Not on file    Attends meetings of clubs or organizations: Not on file    Relationship status: Not on file  Other Topics Concern  . Not on file  Social History Narrative  . Not on file   Additional Social History:    Allergies:  No Known Allergies  Labs:  Results for orders placed or performed during the hospital encounter of 08/06/19 (from the past 48 hour(s))  Comprehensive metabolic panel     Status: Abnormal   Collection Time: 08/06/19  5:03 PM  Result  Value Ref Range   Sodium 141 135 - 145 mmol/L   Potassium 4.0 3.5 - 5.1 mmol/L   Chloride 106 98 - 111 mmol/L   CO2 26 22 - 32 mmol/L   Glucose, Bld 166 (H) 70 - 99 mg/dL   BUN 13 6 - 20 mg/dL   Creatinine, Ser 4.03 0.61 - 1.24 mg/dL   Calcium 9.4 8.9 - 70.9 mg/dL   Total Protein 7.6 6.5 - 8.1 g/dL   Albumin 4.3 3.5 - 5.0 g/dL   AST 30 15 - 41 U/L   ALT 19 0 - 44 U/L   Alkaline Phosphatase 74 38 - 126 U/L   Total Bilirubin 0.7 0.3 - 1.2 mg/dL   GFR calc non Af Amer >60 >60 mL/min   GFR calc Af Amer >60 >60 mL/min   Anion gap 9 5 - 15    Comment: Performed at [facility], [address] [place], [place] [address]  Ethanol     Status: None   Collection Time: 08/06/19  5:03 PM  Result Value Ref Range   Alcohol, Ethyl (B) <10 <10 mg/dL    Comment: (NOTE) Lowest detectable limit for serum alcohol is 10 mg/dL. For medical purposes only. Performed at [facility], [address]  [place], [place] [address]   Salicylate level     Status: None   Collection Time: 08/06/19  5:03 PM  Result Value Ref Range   Salicylate Lvl <7.0 2.8 - 30.0 mg/dL    Comment: Performed at [facility], [address] [place], [place] [address]  Acetaminophen level     Status: Abnormal   Collection Time: 08/06/19  5:03 PM  Result Value Ref Range   Acetaminophen (Tylenol), Serum <10 (L) 10 - 30 ug/mL    Comment: (NOTE) Therapeutic concentrations vary significantly. A range of 10-30 ug/mL  may be an effective concentration for many patients. However, some  are best treated at concentrations outside of this range. Acetaminophen concentrations >150 ug/mL at 4 hours after ingestion  and >50 ug/mL at 12 hours after ingestion are often associated with  toxic reactions. Performed at [facility], [address] [place], [place] [address]   cbc     Status: None   Collection Time: 08/06/19  5:03 PM  Result Value Ref Range   WBC 5.4 4.0 - 10.5 K/uL   RBC 5.26 4.22 - 5.81 MIL/uL   Hemoglobin 16.2 13.0 - 17.0 g/dL   HCT 03.4 03.5 - 24.8 %   MCV 93.9 80.0 - 100.0 fL   MCH 30.8 26.0 - 34.0 pg   MCHC 32.8 30.0 - 36.0 g/dL   RDW 18.5 90.9 - 31.1 %   Platelets 316 150 - 400 K/uL   nRBC 0.0 0.0 - 0.2 %    Comment: Performed at [facility], [address] [place], [place] [address]  Urine Drug Screen, Qualitative     Status: None   Collection Time: 08/06/19  5:03 PM  Result Value Ref Range   Tricyclic, Ur Screen NONE DETECTED NONE DETECTED   Amphetamines, Ur Screen NONE DETECTED NONE DETECTED   MDMA (Ecstasy)Ur Screen NONE DETECTED NONE DETECTED   Cocaine Metabolite,Ur [place] NONE DETECTED NONE DETECTED   Opiate, Ur Screen NONE DETECTED NONE DETECTED   Phencyclidine (PCP) Ur S NONE DETECTED NONE DETECTED   Cannabinoid 50 Ng, Ur [place] NONE DETECTED NONE DETECTED   Barbiturates, Ur Screen NONE DETECTED NONE DETECTED   Benzodiazepine, Ur Scrn NONE  DETECTED NONE DETECTED    Methadone Scn, Ur NONE DETECTED NONE DETECTED    Comment: (NOTE) Tricyclics + metabolites, urine    Cutoff 1000 ng/mL Amphetamines + metabolites, urine  Cutoff 1000 ng/mL MDMA (Ecstasy), urine              Cutoff 500 ng/mL Cocaine Metabolite, urine          Cutoff 300 ng/mL Opiate + metabolites, urine        Cutoff 300 ng/mL Phencyclidine (PCP), urine         Cutoff 25 ng/mL Cannabinoid, urine                 Cutoff 50 ng/mL Barbiturates + metabolites, urine  Cutoff 200 ng/mL Benzodiazepine, urine              Cutoff 200 ng/mL Methadone, urine                   Cutoff 300 ng/mL The urine drug screen provides only a preliminary, unconfirmed analytical test result and should not be used for non-medical purposes. Clinical consideration and professional judgment should be applied to any positive drug screen result due to possible interfering substances. A more specific alternate chemical method must be used in order to obtain a confirmed analytical result. Gas chromatography / mass spectrometry (GC/MS) is the preferred confirmat ory method. Performed at [facility], [place], [place] [address]   SARS CORONAVIRUS 2 (TAT 6-12 HRS) Nasal Swab Aptima Multi Swab     Status: None   Collection Time: 08/06/19  8:44 PM   Specimen: Aptima Multi Swab; Nasal Swab  Result Value Ref Range   SARS Coronavirus 2 NEGATIVE NEGATIVE    Comment: (NOTE) SARS-CoV-2 target nucleic acids are NOT DETECTED. The SARS-CoV-2 RNA is generally detectable in upper and lower respiratory specimens during the acute phase of infection. Negative results do not preclude SARS-CoV-2 infection, do not rule out co-infections with other pathogens, and should not be used as the sole basis for treatment or other patient management decisions. Negative results must be combined with clinical observations, patient history, and epidemiological information. The expected result is Negative. Fact Sheet for  Patients: SugarRoll.be Fact Sheet for Healthcare Providers: https://www.woods-mathews.com/ This test is not yet approved or cleared by the [place] FDA and  has been authorized for detection and/or diagnosis of SARS-CoV-2 by FDA under an Emergency Use Authorization (EUA). This EUA will remain  in effect (meaning this test can be used) for the duration of the COVID-19 declaration under Section 56 4(b)(1) of the Act, 21 U.S.C. section 360bbb-3(b)(1), unless the authorization is terminated or revoked sooner. Performed at [facility], [place] [address] [place], [place] [address]     No current facility-administered medications for this encounter.    No current outpatient medications on file.    Musculoskeletal: Strength & Muscle Tone: within normal limits Gait & Station: normal Patient leans: N/A  Psychiatric Specialty Exam: Physical Exam  Nursing note and vitals reviewed. Constitutional: He is oriented to person, place, and time. He appears well-developed and well-nourished.  HENT:  Head: Normocephalic.  Eyes: Pupils are equal, round, and reactive to light.  Neck: Normal range of motion. Neck supple.  Respiratory: Effort normal.  Musculoskeletal: Normal range of motion.  Neurological: He is alert and oriented to person, place, and time.  Skin: Skin is warm and dry.    Review of Systems  Psychiatric/Behavioral: The patient is nervous/anxious.  All other systems reviewed and are negative.   Blood pressure (!) 144/80, pulse 90, temperature 98.4 F (36.9 C), temperature source Oral, resp. rate 18, height 5\' 8"  (1.727 m), weight 86.2 kg, SpO2 100 %.Body mass index is 28.89 kg/m.  General Appearance: Casual  Eye Contact:  Good  Speech:  Clear and Coherent  Volume:  Normal  Mood:  Anxious and Irritable  Affect:  Congruent  Thought Process:  Coherent  Orientation:  Full (Time, Place, and Person)  Thought Content:  WDL and  Logical  Suicidal Thoughts:  No  Homicidal Thoughts:  No  Memory:  Immediate;   Fair Recent;   Fair Remote;   Fair  Judgement:  Fair  Insight:  Fair  Psychomotor Activity:  Normal  Concentration:  Concentration: Good and Attention Span: Good  Recall:  Good  Fund of Knowledge:  Good  Language:  Fair  Akathisia:  Negative  Handed:  Right  AIMS (if indicated):     Assets:  Communication Skills Desire for Improvement Social Support  ADL's:  Intact  Cognition:  Impaired,  Mild  Sleep:   Good     Treatment Plan Summary: Daily contact with patient to assess and evaluate symptoms and progress in treatment and Plan The patient currently is not on any medication.  Per the group home they are recommending that he is placed on medication to help manage his mood swings before he is accepted back to their facility.  Disposition: No evidence of imminent risk to self or others at present.   Patient does not meet criteria for psychiatric inpatient admission. Supportive therapy provided about ongoing stressors. Per the patient current group home they are recommending that he is placed on medication to assist with his erratic behavior before he is accepted back to the group home.  Gillermo MurdochJacqueline , NP 08/07/2019 6:36 AM

## 2019-09-02 DIAGNOSIS — R451 Restlessness and agitation: Secondary | ICD-10-CM | POA: Diagnosis not present

## 2019-09-02 DIAGNOSIS — R6250 Unspecified lack of expected normal physiological development in childhood: Secondary | ICD-10-CM | POA: Diagnosis not present

## 2019-09-15 DIAGNOSIS — E559 Vitamin D deficiency, unspecified: Secondary | ICD-10-CM | POA: Diagnosis not present

## 2019-09-15 DIAGNOSIS — Z8249 Family history of ischemic heart disease and other diseases of the circulatory system: Secondary | ICD-10-CM | POA: Diagnosis not present

## 2019-09-15 DIAGNOSIS — E7800 Pure hypercholesterolemia, unspecified: Secondary | ICD-10-CM | POA: Diagnosis not present

## 2019-09-15 DIAGNOSIS — H539 Unspecified visual disturbance: Secondary | ICD-10-CM | POA: Diagnosis not present

## 2019-09-15 DIAGNOSIS — I2510 Atherosclerotic heart disease of native coronary artery without angina pectoris: Secondary | ICD-10-CM | POA: Diagnosis not present

## 2019-10-29 DIAGNOSIS — R05 Cough: Secondary | ICD-10-CM | POA: Diagnosis not present

## 2019-11-02 ENCOUNTER — Other Ambulatory Visit: Payer: Self-pay

## 2019-11-02 ENCOUNTER — Emergency Department
Admission: EM | Admit: 2019-11-02 | Discharge: 2019-11-02 | Disposition: A | Discharge status: Home / Self Care | Payer: Medicare Other | Attending: Emergency Medicine | Admitting: Emergency Medicine

## 2019-11-02 DIAGNOSIS — F6381 Intermittent explosive disorder: Secondary | ICD-10-CM | POA: Diagnosis not present

## 2019-11-02 DIAGNOSIS — Z03818 Encounter for observation for suspected exposure to other biological agents ruled out: Secondary | ICD-10-CM | POA: Diagnosis not present

## 2019-11-02 DIAGNOSIS — F329 Major depressive disorder, single episode, unspecified: Secondary | ICD-10-CM | POA: Diagnosis not present

## 2019-11-02 DIAGNOSIS — F489 Nonpsychotic mental disorder, unspecified: Secondary | ICD-10-CM | POA: Insufficient documentation

## 2019-11-02 DIAGNOSIS — R45850 Homicidal ideations: Secondary | ICD-10-CM | POA: Diagnosis not present

## 2019-11-02 DIAGNOSIS — R454 Irritability and anger: Secondary | ICD-10-CM | POA: Diagnosis not present

## 2019-11-02 DIAGNOSIS — F4325 Adjustment disorder with mixed disturbance of emotions and conduct: Secondary | ICD-10-CM | POA: Insufficient documentation

## 2019-11-02 DIAGNOSIS — Z046 Encounter for general psychiatric examination, requested by authority: Secondary | ICD-10-CM | POA: Diagnosis present

## 2019-11-02 DIAGNOSIS — Z20828 Contact with and (suspected) exposure to other viral communicable diseases: Secondary | ICD-10-CM | POA: Insufficient documentation

## 2019-11-02 DIAGNOSIS — F69 Unspecified disorder of adult personality and behavior: Secondary | ICD-10-CM | POA: Insufficient documentation

## 2019-11-02 DIAGNOSIS — F79 Unspecified intellectual disabilities: Secondary | ICD-10-CM

## 2019-11-02 DIAGNOSIS — F73 Profound intellectual disabilities: Secondary | ICD-10-CM | POA: Diagnosis not present

## 2019-11-02 LAB — COMPREHENSIVE METABOLIC PANEL
ALT: 18 U/L (ref 0–44)
AST: 34 U/L (ref 15–41)
Albumin: 4.3 g/dL (ref 3.5–5.0)
Alkaline Phosphatase: 74 U/L (ref 38–126)
Anion gap: 9 (ref 5–15)
BUN: 20 mg/dL (ref 6–20)
CO2: 25 mmol/L (ref 22–32)
Calcium: 9.2 mg/dL (ref 8.9–10.3)
Chloride: 106 mmol/L (ref 98–111)
Creatinine, Ser: 1.17 mg/dL (ref 0.61–1.24)
GFR calc Af Amer: >60 mL/min (ref >60)
GFR calc non Af Amer: >60 mL/min (ref >60)
Glucose, Bld: 104 mg/dL — ABNORMAL HIGH (ref 70–99)
Potassium: 4.1 mmol/L (ref 3.5–5.1)
Sodium: 140 mmol/L (ref 135–145)
Total Bilirubin: 1.2 mg/dL (ref 0.3–1.2)
Total Protein: 7.6 g/dL (ref 6.5–8.1)

## 2019-11-02 LAB — ACETAMINOPHEN LEVEL: Acetaminophen (Tylenol), Serum: <10 ug/mL — ABNORMAL LOW (ref 10–30)

## 2019-11-02 LAB — CBC
HCT: 41.9 % (ref 39.0–52.0)
Hemoglobin: 14.5 g/dL (ref 13.0–17.0)
MCH: 30.9 pg (ref 26.0–34.0)
MCHC: 34.6 g/dL (ref 30.0–36.0)
MCV: 89.3 fL (ref 80.0–100.0)
Platelets: 310 10*3/uL (ref 150–400)
RBC: 4.69 MIL/uL (ref 4.22–5.81)
RDW: 12.5 % (ref 11.5–15.5)
WBC: 7.2 10*3/uL (ref 4.0–10.5)
nRBC: 0 % (ref 0.0–0.2)

## 2019-11-02 LAB — SALICYLATE LEVEL: Salicylate Lvl: <7 mg/dL (ref 2.8–30.0)

## 2019-11-02 LAB — SARS CORONAVIRUS 2 (TAT 6-24 HRS): SARS Coronavirus 2: NEGATIVE

## 2019-11-02 LAB — ETHANOL: Alcohol, Ethyl (B): <10 mg/dL (ref <10)

## 2019-11-02 MED ORDER — RISPERIDONE 1 MG PO TABS
1.0000 mg | ORAL_TABLET | Freq: Once | ORAL | Status: AC
  Administered 2019-11-02: 1 mg via ORAL
  Filled 2019-11-02: qty 1

## 2019-11-02 NOTE — ED Provider Notes (Signed)
[facility] Emergency Department Provider Note  ____________________________________________   I have reviewed the triage vital signs and the nursing notes.   HISTORY  Chief Complaint Psychiatric Evaluation   History limited by: Not Limited   HPI Chad Young is a 48 y.o. male who presents to the emergency department today from his group home because of concerns for anger and outburst.  He states he gets very upset if people at his group home.  He states he notes that he is having these outburst.  States he does not know quite how to control them.  Patient denies any thoughts of self-harm.  He denies any recent medical complaints.   Records reviewed. Per medical record review patient has a history of adjustment disorder, intellectual disorder.  Past Medical History:  Diagnosis Date  . Borderline diabetes   . Excessive anger     Patient Active Problem List   Diagnosis Date Noted  . Moderate intellectual disability 11/10/2018  . Adjustment disorder with mixed disturbance of emotions and conduct 11/10/2018  . Homicidal ideation 11/10/2018    No past surgical history on file.  Prior to Admission medications   Medication Sig Start Date End Date Taking? Authorizing Provider  risperiDONE (RISPERDAL) 1 MG tablet Take 1 tablet (1 mg total) by mouth 2 (two) times daily. 11/10/18 11/10/19  Clapacs, Madie Reno, MD    Allergies Penicillins  No family history on file.  Social History Social History   Tobacco Use  . Smoking status: Never Smoker  . Smokeless tobacco: Never Used  Substance Use Topics  . Alcohol use: Never    Frequency: Never  . Drug use: Never    Review of Systems Constitutional: No fever/chills Eyes: No visual changes. ENT: No sore throat. Cardiovascular: Denies chest pain. Respiratory: Denies shortness of breath. Gastrointestinal: No abdominal pain.  No nausea, no vomiting.  No diarrhea.   Genitourinary: Negative for  dysuria. Musculoskeletal: Negative for back pain. Skin: Negative for rash. Neurological: Negative for headaches, focal weakness or numbness.  ____________________________________________   PHYSICAL EXAM:  VITAL SIGNS: ED Triage Vitals  Enc Vitals Group     BP 11/02/19 0346 131/83     Pulse Rate 11/02/19 0346 75     Resp 11/02/19 0346 16     Temp 11/02/19 0346 98.4 F (36.9 C)     Temp Source 11/02/19 0346 Oral     SpO2 11/02/19 0346 98 %     Weight 11/02/19 0347 189 lb 9.5 oz (86 kg)     Height 11/02/19 0347 5\' 8"  (1.727 m)     Head Circumference --      Peak Flow --      Pain Score 11/02/19 0347 0   Constitutional: Alert and oriented.  Eyes: Conjunctivae are normal.  ENT      Head: Normocephalic and atraumatic.      Nose: No congestion/rhinnorhea.      Mouth/Throat: Mucous membranes are moist.      Neck: No stridor. Hematological/Lymphatic/Immunilogical: No cervical lymphadenopathy. Cardiovascular: Normal rate, regular rhythm.  No murmurs, rubs, or gallops.  Respiratory: Normal respiratory effort without tachypnea nor retractions. Breath sounds are clear and equal bilaterally. No wheezes/rales/rhonchi. Gastrointestinal: Soft and non tender. No rebound. No guarding.  Genitourinary: Deferred Musculoskeletal: Normal range of motion in all extremities. No lower extremity edema. Neurologic:  Normal speech and language. No gross focal neurologic deficits are appreciated.  Skin:  Skin is warm, dry and intact. No rash noted. Psychiatric: Positive for  anger.  ____________________________________________    LABS (pertinent positives/negatives)  Acetaminophen, salicylate, ethanol below threshold CMP wnl except glu 104 CBC wbc 7.2, hgb 14.5, plt 310  ____________________________________________   EKG  None  ____________________________________________     RADIOLOGY  None  ____________________________________________   PROCEDURES  Procedures  ____________________________________________   INITIAL IMPRESSION / ASSESSMENT AND PLAN / ED COURSE  Pertinent labs & imaging results that were available during my care of the patient were reviewed by me and considered in my medical decision making (see chart for details).   Patient presented to the emergency department today because of concern for outbursts and anger issues. Patient does appear calm during my exam. Patient was seen by psychiatry who do feel he would benefit from admission.  ____________________________________________   FINAL CLINICAL IMPRESSION(S) / ED DIAGNOSES  Final diagnoses:  Anger     Note: This dictation was prepared with Dragon dictation. Any transcriptional errors that result from this process are unintentional     Phineas Semen, MD 11/02/19 [phone]

## 2019-11-02 NOTE — ED Notes (Addendum)
Pt states he is unsure of who his legal guardian is but prior note found on chart reports the patients sister helps take care of him and his father is his legal guardian.

## 2019-11-02 NOTE — ED Notes (Signed)
Pt states he has a legal gaurdian but does not know who it is.

## 2019-11-02 NOTE — ED Notes (Signed)
[employer] here to verify patient's identity because he was reported missing.

## 2019-11-02 NOTE — ED Notes (Addendum)
Pt denies SI/HI and AVH. Pt states he does not like the people at his group home.  Calm and cooperative. Maintained on 15 minute checks and observation by security camera for safety.

## 2019-11-02 NOTE — ED Notes (Signed)
Pt given meal tray,

## 2019-11-02 NOTE — ED Triage Notes (Signed)
Pt here voluntary with bpd for "outbursts" per pt at "boarding house". Pt states "I have outburst and need some help". Pt denies SI but states at times he wants to hurt others. Pt cooperative and calm in triage.

## 2019-11-02 NOTE — ED Notes (Signed)
Group home supervisor states she has confirmed the patient does not have a legal guardian.

## 2019-11-02 NOTE — ED Notes (Signed)
Pt. Transferred to Chad Young from ED to room 2 after screening for contraband. Report to include Situation, Background, Assessment and Recommendations from RN. Pt. Oriented to unit including Q15 minute rounds as well as the security cameras for their protection. Patient is alert and oriented, warm and dry in no acute distress. Patient denies SI, HI, and AVH. Pt. Encouraged to let me know if needs arise.

## 2019-11-02 NOTE — ED Notes (Addendum)
Pt contacted patient's sister Lattie Haw [phone]). Lattie Haw made aware patient was in the ER.  Lattie Haw states she has  been taking care of her brother for the past 18 years.

## 2019-11-02 NOTE — ED Notes (Signed)
RN received a phone call from [place].  Patient had been reported missing from his group home.  This Probation officer confirmed patient was in the ER.  Charge nurse made aware of situation.

## 2019-11-02 NOTE — ED Notes (Signed)
Pt discharged back to group home in care of Di Kindle and patient's father.  VS stable. All belongings returned to patient.  Pt denies SI/HI.  Pt signed for discharged. Discharge instructions reviewed with patient.

## 2019-11-02 NOTE — Consult Note (Signed)
[facility] Face-to-Face Psychiatry Consult   Reason for Consult:  Psych evaluation Referring Physician:  Dr. Derrill Kay Patient Identification: Chad Young MRN:  [id number] Principal Diagnosis: <principal problem not specified> Diagnosis:  Active Problems:   Homicidal ideation   Total Time spent with patient: 45 minutes  Subjective:   Chad Young, 48 y.o., male patient seen via face to face by this provider, Dr. Adolphus Birchwood; and chart reviewed on 11/02/19.  On evaluation Chad Young reportsdepression and thoughts of wanting to hurt others.  " I keep having outburst when Im around certain people".  " My family don't want me know more and I want to beat them up really bad." During evaluation Chad Young is alert/oriented x 4; calm/cooperative; and mood is congruent with affect.  He does not appear to be responding to internal/external stimuli or delusional thoughts.  Patient denies suicidal/self-harm but does endorse homicidal ideation.  He denies, psychosis, and paranoia.  Patient's speech is delayed as pt is  IDD.  Pt  answered question appropriately.     HPI:  Per chart and note review Pt here voluntary with bpd for "outbursts" per pt at "boarding house". Pt states "I have outburst and need some help". Pt denies SI but states at times he wants to hurt others. Pt cooperative and calm in triage.  Pt say he was told not to come back to his group home after getting in an argument.  He states he gets very upset if people at his group home. Pt asked for help with his "outburst".  States he does not know quite how to control them.  Patient denies any thoughts of self-harm.  He denies any recent medical complaints.   Past Psychiatric History: yes   Risk to Self:   Risk to Others:   Prior Inpatient Therapy:   Prior Outpatient Therapy:    Past Medical History:  Past Medical History:  Diagnosis Date  . Borderline diabetes   . Excessive anger    No past surgical history on file. Family History: No  family history on file. Family Psychiatric  History: unknown Social History:  Social History   Substance and Sexual Activity  Alcohol Use Never  . Frequency: Never     Social History   Substance and Sexual Activity  Drug Use Never    Social History   Socioeconomic History  . Marital status: Single    Spouse name: Not on file  . Number of children: Not on file  . Years of education: Not on file  . Highest education level: Not on file  Occupational History  . Not on file  Social Needs  . Financial resource strain: Not on file  . Food insecurity    Worry: Not on file    Inability: Not on file  . Transportation needs    Medical: Not on file    Non-medical: Not on file  Tobacco Use  . Smoking status: Never Smoker  . Smokeless tobacco: Never Used  Substance and Sexual Activity  . Alcohol use: Never    Frequency: Never  . Drug use: Never  . Sexual activity: Not on file  Lifestyle  . Physical activity    Days per week: Not on file    Minutes per session: Not on file  . Stress: Not on file  Relationships  . Social Musician on phone: Not on file    Gets together: Not on file    Attends religious service: Not  on file    Active member of club or organization: Not on file    Attends meetings of clubs or organizations: Not on file    Relationship status: Not on file  Other Topics Concern  . Not on file  Social History Narrative  . Not on file   Additional Social History:    Allergies:   Allergies  Allergen Reactions  . Penicillins     Labs:  Results for orders placed or performed during the hospital encounter of 11/02/19 (from the past 48 hour(s))  Comprehensive metabolic panel     Status: Abnormal   Collection Time: 11/02/19  4:00 AM  Result Value Ref Range   Sodium 140 135 - 145 mmol/L   Potassium 4.1 3.5 - 5.1 mmol/L   Chloride 106 98 - 111 mmol/L   CO2 25 22 - 32 mmol/L   Glucose, Bld 104 (H) 70 - 99 mg/dL   BUN 20 6 - 20 mg/dL    Creatinine, Ser 1.611.17 0.61 - 1.24 mg/dL   Calcium 9.2 8.9 - 09.610.3 mg/dL   Total Protein 7.6 6.5 - 8.1 g/dL   Albumin 4.3 3.5 - 5.0 g/dL   AST 34 15 - 41 U/L   ALT 18 0 - 44 U/L   Alkaline Phosphatase 74 38 - 126 U/L   Total Bilirubin 1.2 0.3 - 1.2 mg/dL   GFR calc non Af Amer >60 >60 mL/min   GFR calc Af Amer >60 >60 mL/min   Anion gap 9 5 - 15    Comment: Performed at [facility], [address] [place], [place] [address]  Ethanol     Status: None   Collection Time: 11/02/19  4:00 AM  Result Value Ref Range   Alcohol, Ethyl (B) <10 <10 mg/dL    Comment: (NOTE) Lowest detectable limit for serum alcohol is 10 mg/dL. For medical purposes only. Performed at [facility], [address] [place], [place] [address]   Salicylate level     Status: None   Collection Time: 11/02/19  4:00 AM  Result Value Ref Range   Salicylate Lvl <7.0 2.8 - 30.0 mg/dL    Comment: Performed at [facility], [address] [place], [place] [address]  Acetaminophen level     Status: Abnormal   Collection Time: 11/02/19  4:00 AM  Result Value Ref Range   Acetaminophen (Tylenol), Serum <10 (L) 10 - 30 ug/mL    Comment: (NOTE) Therapeutic concentrations vary significantly. A range of 10-30 ug/mL  may be an effective concentration for many patients. However, some  are best treated at concentrations outside of this range. Acetaminophen concentrations >150 ug/mL at 4 hours after ingestion  and >50 ug/mL at 12 hours after ingestion are often associated with  toxic reactions. Performed at [facility], [address] [place], [place] [address]   cbc     Status: None   Collection Time: 11/02/19  4:00 AM  Result Value Ref Range   WBC 7.2 4.0 - 10.5 K/uL   RBC 4.69 4.22 - 5.81 MIL/uL   Hemoglobin 14.5 13.0 - 17.0 g/dL   HCT 30.841.9 65.739.0 - 84.652.0 %   MCV 89.3 80.0 - 100.0 fL   MCH 30.9 26.0 - 34.0 pg   MCHC 34.6 30.0 - 36.0 g/dL   RDW 96.212.5 95.211.5 - 84.115.5 %   Platelets  310 150 - 400 K/uL   nRBC 0.0 0.0 - 0.2 %    Comment: Performed at [facility], [address], [place], [place] [address]    No current facility-administered medications for this encounter.    Current Outpatient Medications  Medication Sig Dispense Refill  . risperiDONE (RISPERDAL) 1 MG tablet Take 1 tablet (1 mg total) by mouth 2 (two) times daily. 60 tablet 2    Musculoskeletal: Strength & Muscle Tone: within normal limits Gait & Station: normal Patient leans: N/A  Psychiatric Specialty Exam: Physical Exam  Constitutional: He appears well-developed.  HENT:  Head: Normocephalic.  Neck: Normal range of motion.  Respiratory: Effort normal.  Musculoskeletal: Normal range of motion.  Skin: Skin is warm and dry.  Psychiatric: His mood appears anxious. His speech is delayed. He is slowed. Cognition and memory are impaired. He expresses impulsivity. He exhibits a depressed mood. He expresses homicidal ideation.    Review of Systems  Psychiatric/Behavioral: Positive for depression. Negative for substance abuse. The patient is nervous/anxious.   All other systems reviewed and are negative.   Blood pressure 131/83, pulse 75, temperature 98.4 F (36.9 C), temperature source Oral, resp. rate 16, height 5\' 8"  (1.727 m), weight 86 kg, SpO2 98 %.Body mass index is 28.83 kg/m.  General Appearance: Disheveled  Eye Contact:  Fair  Speech:  Clear and Coherent and Slow  Volume:  Normal  Mood:  Angry and Depressed  Affect:  Congruent  Thought Process:  Coherent and Descriptions of Associations: Intact  Orientation:  Full (Time, Place, and Person)  Thought Content:  WDL  Suicidal Thoughts:  No  Homicidal Thoughts:  Yes.  without intent/plan  Memory:  Immediate;   Fair  Judgement:  Impaired  Insight:  Lacking  Psychomotor Activity:  Normal  Concentration:  Concentration: Good  Recall:  Good  Fund of Knowledge:  Good  Language:  Fair  Akathisia:  NA  Handed:  Right  AIMS  (if indicated):     Assets:  Desire for Improvement  ADL's:  Intact  Cognition:  Impaired,  Mild  Sleep:        Treatment Plan Summary: Medication management and Plan inpatient psychiatric admission  Disposition: Recommend psychiatric Inpatient admission when medically cleared. Supportive therapy provided about ongoing stressors.  Deloria Lair, NP 11/02/2019 6:19 AM

## 2019-11-02 NOTE — Consult Note (Signed)
[facility] Face-to-Face Psychiatry Consult   Reason for Consult:  Psych evaluation Referring Physician:  Dr. Derrill Kay Patient Identification: Chad Young MRN:  [id number] Principal Diagnosis: <principal problem not specified> Diagnosis:  Active Problems:   Homicidal ideation      Patient reassessed on morning of 11/02/2019 at approximately 10 AM. During reassessment patient was calm and cooperative, denied any psychiatric complaints including suicidal ideation or homicidal ideation.  Patient at this point does not meet criteria for inpatient hospitalization and will be sent back to his group home with outpatient follow-up.  Original note from nurse practitioner Rolla Etienne below " Total Time spent with patient: 45 minutes  Subjective:   Chad Young, 48 y.o., male patient seen via face to face by this provider, Dr. Adolphus Birchwood; and chart reviewed on 11/02/19.  On evaluation Chad Young reportsdepression and thoughts of wanting to hurt others.  " I keep having outburst when Im around certain people".  " My family don't want me know more and I want to beat them up really bad." During evaluation Chad Young is alert/oriented x 4; calm/cooperative; and mood is congruent with affect.  He does not appear to be responding to internal/external stimuli or delusional thoughts.  Patient denies suicidal/self-harm but does endorse homicidal ideation.  He denies, psychosis, and paranoia.  Patient's speech is delayed as pt is  IDD.  Pt  answered question appropriately.     HPI:  Per chart and note review Pt here voluntary with bpd for "outbursts" per pt at "boarding house". Pt states "I have outburst and need some help". Pt denies SI but states at times he wants to hurt others. Pt cooperative and calm in triage.  Pt say he was told not to come back to his group home after getting in an argument.  He states he gets very upset if people at his group home. Pt asked for help with his "outburst".  States he does  not know quite how to control them.  Patient denies any thoughts of self-harm.  He denies any recent medical complaints."   Past Psychiatric History: Carries a diagnosis of IDD  Risk to Self:  No Risk to Others:  No Prior Inpatient Therapy:  Yes Prior Outpatient Therapy:  Yes  Past Medical History:  Past Medical History:  Diagnosis Date  . Borderline diabetes   . Excessive anger    No past surgical history on file. Family History: No family history on file. Family Psychiatric  History: unknown Social History:  Social History   Substance and Sexual Activity  Alcohol Use Never  . Frequency: Never     Social History   Substance and Sexual Activity  Drug Use Never    Social History   Socioeconomic History  . Marital status: Single    Spouse name: Not on file  . Number of children: Not on file  . Years of education: Not on file  . Highest education level: Not on file  Occupational History  . Not on file  Social Needs  . Financial resource strain: Not on file  . Food insecurity    Worry: Not on file    Inability: Not on file  . Transportation needs    Medical: Not on file    Non-medical: Not on file  Tobacco Use  . Smoking status: Never Smoker  . Smokeless tobacco: Never Used  Substance and Sexual Activity  . Alcohol use: Never    Frequency: Never  . Drug use: Never  .  Sexual activity: Not on file  Lifestyle  . Physical activity    Days per week: Not on file    Minutes per session: Not on file  . Stress: Not on file  Relationships  . Social Herbalist on phone: Not on file    Gets together: Not on file    Attends religious service: Not on file    Active member of club or organization: Not on file    Attends meetings of clubs or organizations: Not on file    Relationship status: Not on file  Other Topics Concern  . Not on file  Social History Narrative  . Not on file   Additional Social History:    Allergies:   Allergies  Allergen  Reactions  . Penicillins     Labs:  Results for orders placed or performed during the hospital encounter of 11/02/19 (from the past 48 hour(s))  Comprehensive metabolic panel     Status: Abnormal   Collection Time: 11/02/19  4:00 AM  Result Value Ref Range   Sodium 140 135 - 145 mmol/L   Potassium 4.1 3.5 - 5.1 mmol/L   Chloride 106 98 - 111 mmol/L   CO2 25 22 - 32 mmol/L   Glucose, Bld 104 (H) 70 - 99 mg/dL   BUN 20 6 - 20 mg/dL   Creatinine, Ser 1.17 0.61 - 1.24 mg/dL   Calcium 9.2 8.9 - 10.3 mg/dL   Total Protein 7.6 6.5 - 8.1 g/dL   Albumin 4.3 3.5 - 5.0 g/dL   AST 34 15 - 41 U/L   ALT 18 0 - 44 U/L   Alkaline Phosphatase 74 38 - 126 U/L   Total Bilirubin 1.2 0.3 - 1.2 mg/dL   GFR calc non Af Amer >60 >60 mL/min   GFR calc Af Amer >60 >60 mL/min   Anion gap 9 5 - 15    Comment: Performed at [facility], [address] [place], [place] [address]  Ethanol     Status: None   Collection Time: 11/02/19  4:00 AM  Result Value Ref Range   Alcohol, Ethyl (B) <10 <10 mg/dL    Comment: (NOTE) Lowest detectable limit for serum alcohol is 10 mg/dL. For medical purposes only. Performed at [facility], [address] [place], [place] [address]   Salicylate level     Status: None   Collection Time: 11/02/19  4:00 AM  Result Value Ref Range   Salicylate Lvl <2.4 2.8 - 30.0 mg/dL    Comment: Performed at [facility], [address] [place], [place] [address]  Acetaminophen level     Status: Abnormal   Collection Time: 11/02/19  4:00 AM  Result Value Ref Range   Acetaminophen (Tylenol), Serum <10 (L) 10 - 30 ug/mL    Comment: (NOTE) Therapeutic concentrations vary significantly. A range of 10-30 ug/mL  may be an effective concentration for many patients. However, some  are best treated at concentrations outside of this range. Acetaminophen concentrations >150 ug/mL at 4 hours after ingestion  and >50 ug/mL at 12 hours after ingestion are often  associated with  toxic reactions. Performed at [facility], [address] [place], [place] [address]   cbc     Status: None   Collection Time: 11/02/19  4:00 AM  Result Value Ref Range   WBC 7.2 4.0 - 10.5 K/uL   RBC 4.69 4.22 - 5.81 MIL/uL   Hemoglobin 14.5 13.0 -  17.0 g/dL   HCT 16.141.9 09.639.0 - 04.552.0 %   MCV 89.3 80.0 - 100.0 fL   MCH 30.9 26.0 - 34.0 pg   MCHC 34.6 30.0 - 36.0 g/dL   RDW 40.912.5 81.111.5 - 91.415.5 %   Platelets 310 150 - 400 K/uL   nRBC 0.0 0.0 - 0.2 %    Comment: Performed at [facility], [address] [place], [place] [address]    Current Facility-Administered Medications  Medication Dose Route Frequency Provider Last Rate Last Dose  . risperiDONE (RISPERDAL) tablet 1 mg  1 mg Oral Once Muzammil Bruins, Worthy RancherPaul A, MD       Current Outpatient Medications  Medication Sig Dispense Refill  . risperiDONE (RISPERDAL) 1 MG tablet Take 1 tablet (1 mg total) by mouth 2 (two) times daily. 60 tablet 2    Musculoskeletal: Strength & Muscle Tone: within normal limits Gait & Station: normal Patient leans: N/A  Psychiatric Specialty Exam: Physical Exam  Constitutional: He appears well-developed.  HENT:  Head: Normocephalic.  Neck: Normal range of motion.  Respiratory: Effort normal.  Musculoskeletal: Normal range of motion.  Skin: Skin is warm and dry.  Psychiatric: His mood appears anxious. His speech is delayed. He is slowed. Cognition and memory are impaired. He expresses impulsivity. He exhibits a depressed mood. He expresses homicidal ideation.    Review of Systems  Psychiatric/Behavioral: Positive for depression. Negative for substance abuse. The patient is nervous/anxious.   All other systems reviewed and are negative.   Blood pressure 131/83, pulse 75, temperature 98.4 F (36.9 C), temperature source Oral, resp. rate 16, height 5\' 8"  (1.727 m), weight 86 kg, SpO2 98 %.Body mass index is 28.83 kg/m.  General Appearance: Disheveled  Eye Contact:   Fair  Speech:  Garbled and Slow  Volume:  Normal  Mood:  Anxious  Affect:  Congruent  Thought Process:  Coherent and Descriptions of Associations: Intact  Orientation:  Full (Time, Place, and Person)  Thought Content:  WDL  Suicidal Thoughts:  No  Homicidal Thoughts:  No  Memory:  Immediate;   Fair  Judgement:  Impaired  Insight:  Lacking  Psychomotor Activity:  Normal  Concentration:  Concentration: Fair  Recall:  Good  Fund of Knowledge:  Good  Language:  Fair  Akathisia:  NA  Handed:  Right  AIMS (if indicated):     Assets:  Desire for Improvement  ADL's:  Intact  Cognition:  Impaired,  Mild  Sleep:        Treatment Plan Summary:  48 year old male with IDD who presents after incident at the group home during which patient had an outburst.  Patient is now compliant with medication, calm, and cooperative.  Denying any suicidal homicidal ideation.  He does not meet criteria for inpatient hospitalization will be discharged back to his group home with outpatient follow-up.  Diagnosis: IDD with behavioral outburst  Medications: No med changes,  continue with risperidone 1 mg p.o. twice daily until next outpatient appointment.  Disposition: No evidence of imminent risk to self or others at present.   Patient does not meet criteria for psychiatric inpatient admission. Supportive therapy provided about ongoing stressors. Discussed crisis plan, support from social network, calling 911, coming to the Emergency Department, and calling Suicide Hotline.  Clement SayresPaul A Cheri Ayotte, MD 11/02/2019 10:04 AM

## 2019-11-02 NOTE — ED Notes (Signed)
Pt group home supervisor and father waiting in lobby.  Pt will be discharged back to group home.  Pt dressing for discharge at this time. Maintained on 15 minute checks and observation by security camera for safety.

## 2019-11-02 NOTE — ED Notes (Signed)
This tech and Olen Cordial, Therapist, sports in to dress pt into burgandy scrubs. Pts belongings consists of flannel shirt, jeans, boxer, white socks, wallet inside jeans pockets (60 dollars and 95 cents in pts pants), blue shoes, black leather jacket(umbrella, cell phone, and one meds bottle inside jacket), pink mask. Pts belongings given to primary nurse; 2 bags labeled with pts name.

## 2019-11-03 DIAGNOSIS — Z1389 Encounter for screening for other disorder: Secondary | ICD-10-CM | POA: Diagnosis not present

## 2019-11-03 DIAGNOSIS — Z136 Encounter for screening for cardiovascular disorders: Secondary | ICD-10-CM | POA: Diagnosis not present

## 2019-11-03 DIAGNOSIS — E7800 Pure hypercholesterolemia, unspecified: Secondary | ICD-10-CM | POA: Diagnosis not present

## 2019-11-03 DIAGNOSIS — Z0000 Encounter for general adult medical examination without abnormal findings: Secondary | ICD-10-CM | POA: Diagnosis not present

## 2019-11-04 DIAGNOSIS — Z638 Other specified problems related to primary support group: Secondary | ICD-10-CM | POA: Diagnosis not present

## 2019-11-04 DIAGNOSIS — M79661 Pain in right lower leg: Secondary | ICD-10-CM | POA: Diagnosis not present

## 2019-11-04 DIAGNOSIS — G603 Idiopathic progressive neuropathy: Secondary | ICD-10-CM | POA: Diagnosis not present

## 2019-11-04 DIAGNOSIS — Z609 Problem related to social environment, unspecified: Secondary | ICD-10-CM | POA: Diagnosis not present

## 2019-11-06 ENCOUNTER — Encounter: Payer: Self-pay | Admitting: Emergency Medicine

## 2020-05-02 ENCOUNTER — Other Ambulatory Visit: Payer: Self-pay

## 2020-05-02 ENCOUNTER — Emergency Department
Admission: EM | Admit: 2020-05-02 | Discharge: 2020-05-20 | Disposition: A | Discharge status: Home / Self Care | Payer: Medicare Other | Attending: Emergency Medicine | Admitting: Emergency Medicine

## 2020-05-02 ENCOUNTER — Encounter: Payer: Self-pay | Admitting: Emergency Medicine

## 2020-05-02 DIAGNOSIS — F4320 Adjustment disorder, unspecified: Secondary | ICD-10-CM | POA: Insufficient documentation

## 2020-05-02 DIAGNOSIS — F4325 Adjustment disorder with mixed disturbance of emotions and conduct: Secondary | ICD-10-CM | POA: Diagnosis present

## 2020-05-02 DIAGNOSIS — Z79899 Other long term (current) drug therapy: Secondary | ICD-10-CM | POA: Insufficient documentation

## 2020-05-02 DIAGNOSIS — F79 Unspecified intellectual disabilities: Secondary | ICD-10-CM | POA: Diagnosis not present

## 2020-05-02 DIAGNOSIS — I2510 Atherosclerotic heart disease of native coronary artery without angina pectoris: Secondary | ICD-10-CM | POA: Insufficient documentation

## 2020-05-02 DIAGNOSIS — F489 Nonpsychotic mental disorder, unspecified: Secondary | ICD-10-CM | POA: Diagnosis present

## 2020-05-02 DIAGNOSIS — R4689 Other symptoms and signs involving appearance and behavior: Secondary | ICD-10-CM | POA: Diagnosis present

## 2020-05-02 DIAGNOSIS — Z03818 Encounter for observation for suspected exposure to other biological agents ruled out: Secondary | ICD-10-CM | POA: Diagnosis not present

## 2020-05-02 DIAGNOSIS — F3160 Bipolar disorder, current episode mixed, unspecified: Secondary | ICD-10-CM | POA: Diagnosis not present

## 2020-05-02 DIAGNOSIS — R45850 Homicidal ideations: Secondary | ICD-10-CM

## 2020-05-02 DIAGNOSIS — Z20822 Contact with and (suspected) exposure to covid-19: Secondary | ICD-10-CM | POA: Insufficient documentation

## 2020-05-02 DIAGNOSIS — F29 Unspecified psychosis not due to a substance or known physiological condition: Secondary | ICD-10-CM | POA: Diagnosis present

## 2020-05-02 DIAGNOSIS — R454 Irritability and anger: Secondary | ICD-10-CM | POA: Diagnosis present

## 2020-05-02 DIAGNOSIS — F69 Unspecified disorder of adult personality and behavior: Secondary | ICD-10-CM | POA: Diagnosis present

## 2020-05-02 LAB — COMPREHENSIVE METABOLIC PANEL
ALT: 14 U/L (ref 0–44)
AST: 22 U/L (ref 15–41)
Albumin: 4.2 g/dL (ref 3.5–5.0)
Alkaline Phosphatase: 77 U/L (ref 38–126)
Anion gap: 9 (ref 5–15)
BUN: 13 mg/dL (ref 6–20)
CO2: 25 mmol/L (ref 22–32)
Calcium: 9.3 mg/dL (ref 8.9–10.3)
Chloride: 105 mmol/L (ref 98–111)
Creatinine, Ser: 1.19 mg/dL (ref 0.61–1.24)
GFR calc Af Amer: >60 mL/min (ref >60)
GFR calc non Af Amer: >60 mL/min (ref >60)
Glucose, Bld: 139 mg/dL — ABNORMAL HIGH (ref 70–99)
Potassium: 3.6 mmol/L (ref 3.5–5.1)
Sodium: 139 mmol/L (ref 135–145)
Total Bilirubin: 1.1 mg/dL (ref 0.3–1.2)
Total Protein: 7.5 g/dL (ref 6.5–8.1)

## 2020-05-02 LAB — CBC
HCT: 43.6 % (ref 39.0–52.0)
Hemoglobin: 14.5 g/dL (ref 13.0–17.0)
MCH: 30.7 pg (ref 26.0–34.0)
MCHC: 33.3 g/dL (ref 30.0–36.0)
MCV: 92.2 fL (ref 80.0–100.0)
Platelets: 284 10*3/uL (ref 150–400)
RBC: 4.73 MIL/uL (ref 4.22–5.81)
RDW: 12.4 % (ref 11.5–15.5)
WBC: 5.7 10*3/uL (ref 4.0–10.5)
nRBC: 0 % (ref 0.0–0.2)

## 2020-05-02 LAB — SALICYLATE LEVEL: Salicylate Lvl: <7 mg/dL — ABNORMAL LOW (ref 7.0–30.0)

## 2020-05-02 LAB — ACETAMINOPHEN LEVEL: Acetaminophen (Tylenol), Serum: <10 ug/mL — ABNORMAL LOW (ref 10–30)

## 2020-05-02 LAB — ETHANOL: Alcohol, Ethyl (B): <10 mg/dL (ref <10)
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
## 2020-05-02 NOTE — ED Notes (Signed)
Pt. Alert and oriented, warm and dry, in no distress. Pt. Denies SI, HI, and AVH. Pt. Encouraged to let nursing staff know of any concerns or needs. 
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
## 2020-05-02 NOTE — ED Notes (Signed)
Patient belongings  Red short sleeve shirt Brown belt [employer] Black watch $5 House key Black sneakers Blue jeans Pain black socks Black short under ware 1 pink and black mask

## 2020-05-02 NOTE — ED Triage Notes (Signed)
Arrives to ED for evaluation, voluntarily.  States he has been feeling suicidal, and homicidal.  States he has been feeling this way since he was last discharged from the hospital to his sister's house.  Patient states "I did not want to go back there".  Denies current feelings of SI/ HI.  Calm and cooperative.

## 2020-05-02 NOTE — ED Notes (Signed)
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
## 2020-05-03 DIAGNOSIS — F3160 Bipolar disorder, current episode mixed, unspecified: Secondary | ICD-10-CM | POA: Diagnosis not present

## 2020-05-03 LAB — URINE DRUG SCREEN, QUALITATIVE (ARMC ONLY)
Amphetamines, Ur Screen: NOT DETECTED
Barbiturates, Ur Screen: NOT DETECTED
Benzodiazepine, Ur Scrn: NOT DETECTED
Cannabinoid 50 Ng, Ur ~~LOC~~: NOT DETECTED
Cocaine Metabolite,Ur ~~LOC~~: NOT DETECTED
MDMA (Ecstasy)Ur Screen: NOT DETECTED
Methadone Scn, Ur: NOT DETECTED
Opiate, Ur Screen: NOT DETECTED
Phencyclidine (PCP) Ur S: NOT DETECTED
Tricyclic, Ur Screen: NOT DETECTED

## 2020-05-03 LAB — SARS CORONAVIRUS 2 BY RT PCR (HOSPITAL ORDER, PERFORMED IN ~~LOC~~ HOSPITAL LAB): SARS Coronavirus 2: NEGATIVE

## 2020-05-03 MED ORDER — DULOXETINE HCL 20 MG PO CPEP
20.0000 mg | ORAL_CAPSULE | Freq: Every day | ORAL | Status: DC
  Filled 2020-05-03: qty 1

## 2020-05-03 MED ORDER — ARIPIPRAZOLE 5 MG PO TABS
5.0000 mg | ORAL_TABLET | Freq: Every day | ORAL | Status: DC
  Administered 2020-05-09: 5 mg via ORAL
  Filled 2020-05-03 (×8): qty 1

## 2020-05-03 MED ORDER — BENZTROPINE MESYLATE 1 MG PO TABS
0.5000 mg | ORAL_TABLET | Freq: Two times a day (BID) | ORAL | Status: DC
  Administered 2020-05-04 – 2020-05-13 (×7): 0.5 mg via ORAL
  Filled 2020-05-03 (×18): qty 1

## 2020-05-03 MED ORDER — SERTRALINE HCL 50 MG PO TABS
50.0000 mg | ORAL_TABLET | Freq: Every day | ORAL | Status: DC
  Administered 2020-05-03 – 2020-05-11 (×4): 50 mg via ORAL
  Filled 2020-05-03 (×9): qty 1

## 2020-05-03 NOTE — BH Assessment (Signed)
TTS reassessment completed. Pt presented pleasant, calm and oriented x 4.Pt expressed wishes to return to his sisters home or locate another placement.  Pt denies any current SI/HI/AH/VH and was able to contract his safety.  Pt refused to provide contact information for his sister.   Final disposition pending psych reassessment.

## 2020-05-03 NOTE — Consult Note (Signed)
[facility] Psych ED Progress Note  05/03/2020 4:45 PM Chad Young  MRN:  [id number]   Subjective:  I do not want to talk    Principal Problem: <principal problem not specified> Diagnosis:  Active Problems:   Intellectual disability   Adjustment disorder with mixed disturbance of emotions and conduct   Homicidal ideation   Aggression   Bipolar affective disorder, current episode mixed (HCC)   Anger   Mental and behavioral problem in adult  Total Time spent with patient:   One hour     Past Psychiatric History:   History of family discord, sibling discord, adjustment issues, IED symptoms generalized anxiety --and IDD   Along with speech articulation disorder and IDD   Past Medical History:  Past Medical History:  Diagnosis Date  . Borderline diabetes   . Coronary artery disease   . Excessive anger    History reviewed. No pertinent surgical history. Family History: No family history on file. Family Psychiatric  History:  Cannot get ahold of family at this time   Social History: --Previously lived with sister   Now wants to live with God Father   He is on IVC after she found knife and he was threatening to her post arguments at their house.  However has past issues of malingering and being inpatient to avoid social issues.  In same situation here.   Says if he is with God father the knife issue would not be a problem as he is away from the sister  Social History   Substance and Sexual Activity  Alcohol Use Not Currently     Social History   Substance and Sexual Activity  Drug Use Not Currently    Social History   Socioeconomic History  . Marital status: Single    Spouse name: Not on file  . Number of children: Not on file  . Years of education: Not on file  . Highest education level: Not on file  Occupational History  . Not on file  Tobacco Use  . Smoking status: Never Smoker  . Smokeless tobacco: Never Used  Substance and Sexual Activity  . Alcohol use:  Not Currently  . Drug use: Not Currently  . Sexual activity: Not on file  Other Topics Concern  . Not on file  Social History Narrative   ** Merged History Encounter **       Social Determinants of Health   Financial Resource Strain:   . Difficulty of Paying Living Expenses:   Food Insecurity:   . Worried About Programme researcher, broadcasting/film/video in the Last Year:   . Barista in the Last Year:   Transportation Needs:   . Freight forwarder (Medical):   Marland Kitchen Lack of Transportation (Non-Medical):   Physical Activity:   . Days of Exercise per Week:   . Minutes of Exercise per Session:   Stress:   . Feeling of Stress :   Social Connections:   . Frequency of Communication with Friends and Family:   . Frequency of Social Gatherings with Friends and Family:   . Attends Religious Services:   . Active Member of Clubs or Organizations:   . Attends Banker Meetings:   Marland Kitchen Marital Status:     Sleep:  No new complaint   Appetite:  Good   Current Medications: Current Facility-Administered Medications  Medication Dose Route Frequency Provider Last Rate Last Admin  . ARIPiprazole (ABILIFY) tablet 5 mg  5 mg Oral Daily  Roselind Messier, MD      . benztropine (COGENTIN) tablet 0.5 mg  0.5 mg Oral BID Roselind Messier, MD      . DULoxetine (CYMBALTA) DR capsule 20 mg  20 mg Oral Daily Roselind Messier, MD       Current Outpatient Medications  Medication Sig Dispense Refill  . sertraline (ZOLOFT) 50 MG tablet Take 50 mg by mouth daily.      Lab Results:  Results for orders placed or performed during the hospital encounter of 05/02/20 (from the past 48 hour(s))  Urine Drug Screen, Qualitative     Status: None   Collection Time: 05/02/20  5:54 AM  Result Value Ref Range   Tricyclic, Ur Screen NONE DETECTED NONE DETECTED   Amphetamines, Ur Screen NONE DETECTED NONE DETECTED   MDMA (Ecstasy)Ur Screen NONE DETECTED NONE DETECTED   Cocaine Metabolite,Ur [place] NONE DETECTED NONE  DETECTED   Opiate, Ur Screen NONE DETECTED NONE DETECTED   Phencyclidine (PCP) Ur S NONE DETECTED NONE DETECTED   Cannabinoid 50 Ng, Ur [place] NONE DETECTED NONE DETECTED   Barbiturates, Ur Screen NONE DETECTED NONE DETECTED   Benzodiazepine, Ur Scrn NONE DETECTED NONE DETECTED   Methadone Scn, Ur NONE DETECTED NONE DETECTED    Comment: (NOTE) Tricyclics + metabolites, urine    Cutoff 1000 ng/mL Amphetamines + metabolites, urine  Cutoff 1000 ng/mL MDMA (Ecstasy), urine              Cutoff 500 ng/mL Cocaine Metabolite, urine          Cutoff 300 ng/mL Opiate + metabolites, urine        Cutoff 300 ng/mL Phencyclidine (PCP), urine         Cutoff 25 ng/mL Cannabinoid, urine                 Cutoff 50 ng/mL Barbiturates + metabolites, urine  Cutoff 200 ng/mL Benzodiazepine, urine              Cutoff 200 ng/mL Methadone, urine                   Cutoff 300 ng/mL The urine drug screen provides only a preliminary, unconfirmed analytical test result and should not be used for non-medical purposes. Clinical consideration and professional judgment should be applied to any positive drug screen result due to possible interfering substances. A more specific alternate chemical method must be used in order to obtain a confirmed analytical result. Gas chromatography / mass spectrometry (GC/MS) is the preferred confirmat ory method. Performed at [facility], [address] [place], [place] [address]   Comprehensive metabolic panel     Status: Abnormal   Collection Time: 05/02/20  4:52 PM  Result Value Ref Range   Sodium 139 135 - 145 mmol/L   Potassium 3.6 3.5 - 5.1 mmol/L   Chloride 105 98 - 111 mmol/L   CO2 25 22 - 32 mmol/L   Glucose, Bld 139 (H) 70 - 99 mg/dL    Comment: Glucose reference range applies only to samples taken after fasting for at least 8 hours.   BUN 13 6 - 20 mg/dL   Creatinine, Ser 3.97 0.61 - 1.24 mg/dL   Calcium 9.3 8.9 - 67.3 mg/dL   Total Protein 7.5 6.5 - 8.1  g/dL   Albumin 4.2 3.5 - 5.0 g/dL   AST 22 15 - 41 U/L   ALT 14 0 - 44 U/L   Alkaline Phosphatase 77 38 - 126 U/L  Total Bilirubin 1.1 0.3 - 1.2 mg/dL   GFR calc non Af Amer >60 >60 mL/min   GFR calc Af Amer >60 >60 mL/min   Anion gap 9 5 - 15    Comment: Performed at [facility], [address] [place], [place] [address]  Ethanol     Status: None   Collection Time: 05/02/20  4:52 PM  Result Value Ref Range   Alcohol, Ethyl (B) <10 <10 mg/dL    Comment: (NOTE) Lowest detectable limit for serum alcohol is 10 mg/dL. For medical purposes only. Performed at [facility], [address] [place], [place] [address]   Salicylate level     Status: Abnormal   Collection Time: 05/02/20  4:52 PM  Result Value Ref Range   Salicylate Lvl <0.9 (L) 7.0 - 30.0 mg/dL    Comment: Performed at [facility], [address] [place], [place] [address]  Acetaminophen level     Status: Abnormal   Collection Time: 05/02/20  4:52 PM  Result Value Ref Range   Acetaminophen (Tylenol), Serum <10 (L) 10 - 30 ug/mL    Comment: (NOTE) Therapeutic concentrations vary significantly. A range of 10-30 ug/mL  may be an effective concentration for many patients. However, some  are best treated at concentrations outside of this range. Acetaminophen concentrations >150 ug/mL at 4 hours after ingestion  and >50 ug/mL at 12 hours after ingestion are often associated with  toxic reactions. Performed at [facility], [address] [place], [place] [address]   cbc     Status: None   Collection Time: 05/02/20  4:52 PM  Result Value Ref Range   WBC 5.7 4.0 - 10.5 K/uL   RBC 4.73 4.22 - 5.81 MIL/uL   Hemoglobin 14.5 13.0 - 17.0 g/dL   HCT 43.6 39.0 - 52.0 %   MCV 92.2 80.0 - 100.0 fL   MCH 30.7 26.0 - 34.0 pg   MCHC 33.3 30.0 - 36.0 g/dL   RDW 12.4 11.5 - 15.5 %   Platelets 284 150 - 400 K/uL   nRBC 0.0 0.0 - 0.2 %    Comment: Performed at [facility], [address] [place], [place] [address]  SARS Coronavirus 2 by RT PCR (hospital order, performed in [facility] hospital lab) Nasopharyngeal Nasopharyngeal Swab     Status: None   Collection Time: 05/02/20 10:45 PM   Specimen: Nasopharyngeal Swab  Result Value Ref Range   SARS Coronavirus 2 NEGATIVE NEGATIVE    Comment: (NOTE) SARS-CoV-2 target nucleic acids are NOT DETECTED. The SARS-CoV-2 RNA is generally detectable in upper and lower respiratory specimens during the acute phase of infection. The lowest concentration of SARS-CoV-2 viral copies this assay can detect is 250 copies / mL. A negative result does not preclude SARS-CoV-2 infection and should not be used as the sole basis for treatment or other patient management decisions.  A negative result may occur with improper specimen collection / handling, submission of specimen other than nasopharyngeal swab, presence of viral mutation(s) within the areas targeted by this assay, and inadequate number of viral copies (<250 copies / mL). A negative result must be combined with clinical observations, patient history, and epidemiological information. Fact Sheet for Patients:   StrictlyIdeas.no Fact Sheet for Healthcare Providers: BankingDealers.co.za This test is not yet approved or cleared  by the [place] FDA and has been authorized for detection and/or diagnosis of SARS-CoV-2 by FDA under an Emergency Use Authorization (EUA).  This EUA will remain in effect (meaning this test can be used) for the duration of the COVID-19 declaration under Section 564(b)(1) of the Act, 21 U.S.C. section 360bbb-3(b)(1), unless the authorization is terminated or revoked sooner. Performed at [facility], [address] [place], [place] [address]     Blood Alcohol level:  Lab Results  Component Value Date   [facility] <10 05/02/2020   ETH <10 11/02/2019    Physical Findings: AIMS:  all  negative by observation only , ,  ,  ,    CIWA:    COWS:     Musculoskeletal: Strength & Muscle Tone: cannot assess he is not cooperative Gait & Station:  Reportedly ambulatory Patient leans:   Psychiatric Specialty Exam: Physical Exam  Review of Systems  Blood pressure 102/62, pulse 74, temperature 98.1 F (36.7 C), temperature source Oral, resp. rate 17, height 5\' 8"  (1.727 m), weight 86 kg, SpO2 98 %.Body mass index is 28.83 kg/m.      Mental Status     Limited not very cooperative    Appearance, somewhat haggard  Rapport --poor angry name calling belittling  Concentration and attention poor  Demeanor --argumentative oppositional  Mood angry edgy irritable ---shouting   Consciousness --not clouded or fluctuant   Safety margin not clear ---yet he is not verbal enough  Verbal   Speech  Speech articulation disorder     Judgement insight reliability all poor  Intelligence and fund of knowledge all poor                                 Treatment Plan Summary:  Disposition pending, most likely will not be admitted but needs diff disposition   Abilify 5 mg po qhs Cogentin 0.5 bid And cymbalta 20 written but so far he is refusing   , MD 05/03/2020, 4:45 PM

## 2020-05-03 NOTE — ED Provider Notes (Signed)
Emergency Medicine Observation Re-evaluation Note  Chad Young is a 49 y.o. male, seen on rounds today.  Pt initially presented to the ED for complaints of Mental Health Problem Currently, the patient is resting in no acute distress.  Physical Exam  BP 137/84 (BP Location: Left Arm)   Temp 98.7 F (37.1 C) (Oral)   Resp 16   Ht 5\' 8"  (1.727 m)   Wt 86 kg   SpO2 98%   BMI 28.83 kg/m  Physical Exam  ED Course / MDM  EKG:    I have reviewed the labs performed to date as well as medications administered while in observation.  Recent changes in the last 24 hours include no events overnight. Plan  Current plan is for psychiatric disposition. Patient is not under full IVC at this time.   , MD 05/03/20 [phone]

## 2020-05-03 NOTE — ED Notes (Signed)
Pt standing in the common area talking to other patients. Pt is calm and cooperative. Pt asking for this RN to verify his meds as the doctor here as changed them. Pt wants to take the medicine that [facility] placed him on. Looking back in the chart, meds were changed and pt has been refusing them. Will speak with NP.

## 2020-05-03 NOTE — ED Notes (Signed)
Hourly rounding reveals patient in room. No complaints, stable, in no acute distress. Q15 minute rounds and monitoring via Security Cameras to continue. 

## 2020-05-03 NOTE — ED Notes (Signed)
VOL/CONSULT COMPLETED/ REC. INPT ADMIT

## 2020-05-03 NOTE — ED Notes (Signed)
Pt wanting to speak with TTS. Informed VM will be left and TTS will come see him when they are available. Pt stating he does not want to go live with his sister. Pt states he will call his godfather and see if he will let him go stay with him.

## 2020-05-03 NOTE — ED Notes (Signed)
VOL  PENDING  Disposition

## 2020-05-03 NOTE — ED Notes (Signed)
Report to include Situation, Background, Assessment, and Recommendations received from RN Kim. Patient alert and oriented, warm and dry, in no acute distress. Patient denies SI, HI, AVH and pain. Patient made aware of Q15 minute rounds and Psychologist, counselling presence for their safety. Patient instructed to come to me with needs or concerns.

## 2020-05-03 NOTE — ED Notes (Signed)
Pt denies SI/HI/VH on assessment. Pt endorses AH toward his family members. Pt requested to give his sister's number so staff can update his contact information and pt refused stating he does not want his sister to know everything that happens to him while he is here.

## 2020-05-03 NOTE — ED Notes (Signed)
Introduced myself to patient.  Sandwich tray, drink and icecream provided.  Pt has no other needs at this time.
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
## 2020-05-04 DIAGNOSIS — F3160 Bipolar disorder, current episode mixed, unspecified: Secondary | ICD-10-CM | POA: Diagnosis not present

## 2020-05-04 NOTE — TOC Progression Note (Addendum)
Transition of Care [facility]) - Progression Note    Patient Details  Name: Chad Young MRN: [id number] Date of Birth: 1971/04/18  Transition of Care [facility]) CM/SW Contact  [name] Phone Number: [phone] 05/04/2020, 4:08 PM  Clinical Narrative:     This CSw spoke w/ patient's sister [name] [phone], for status update.  Pt. DOES NOT have legal guardian.  [name] stated she and her father started the paperwork when the patient was in [place] but they never completed the paperwork. [name] stated she does not want the patient returning to her home because "for the last three weeks it's been hell and I know there's something going on with him."  Ms. White states she would rather the patient to stay w/ his godfather , "because he lets Kathlene November do whatever he wants and he doesn't have a good lifestyle."  This CSW explained to Ms. White that since the patient does not have a legal guardian legally he can make his own decisions.  This CSW explained to Ms. White that if she wanted to make decisions for her brother she would need to complete the paperwork and register w/ the magistrate to become his legal guardian. [name] stated her understanding.  [name] stated "I don't understand why they wouldn't send him somewhere to get help?"  This CSW stated she would update Ms. White on patient disposition.  This CSW left voicemail for patient's God father [name] [phone].       Expected Discharge Plan and Services                                                 Social Determinants of Health (SDOH) Interventions    Readmission Risk Interventions No flowsheet data found.

## 2020-05-04 NOTE — BH Assessment (Signed)
Writer spoke with patient and obtained contact information for his God Father [phone]) and another contact Thurston Hole [phone]). Wrier forwarded the information to ER Social Work Young Berry). Also provided her with the patient's sister number ([phone]).

## 2020-05-04 NOTE — ED Notes (Signed)
VOL/ PENDING DISPOSITION 

## 2020-05-04 NOTE — ED Provider Notes (Signed)
Emergency Medicine Observation Re-evaluation Note  Chad Young is a 49 y.o. male, seen on rounds today.  Pt initially presented to the ED for complaints of Mental Health Problem Currently, the patient is resting.  Physical Exam  BP (!) 124/95 (BP Location: Right Arm)   Pulse 93   Temp 98.3 F (36.8 C) (Oral)   Resp 16   Ht 1.727 m (5\' 8" )   Wt 86 kg   SpO2 98%   BMI 28.83 kg/m  Physical Exam  ED Course / MDM  EKG:    I have reviewed the labs performed to date as well as medications administered while in observation.  Recent changes in the last 24 hours include N/A. Plan  Current plan is for psychiatric and/or social work disposition. Patient is not under full IVC at this time.   , MD 05/04/20 [phone]

## 2020-05-04 NOTE — ED Notes (Signed)
Assessment completed  He refuses meds this am  - he refuses to give me his sisters name and phone number and he reported he could go to his "god fathers house" but he refuses to give that phone number and info also  - he is stomping in to the dayroom

## 2020-05-04 NOTE — ED Notes (Signed)
Pt refused snack stating he saved cookies from earlier today.
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
## 2020-05-05 DIAGNOSIS — F3160 Bipolar disorder, current episode mixed, unspecified: Secondary | ICD-10-CM | POA: Diagnosis not present

## 2020-05-05 NOTE — ED Notes (Signed)
VOLUNTARY, awaits poss d/c to Godfather per Dr Smith Robert

## 2020-05-05 NOTE — TOC Progression Note (Addendum)
Transition of Care [facility]) - Progression Note    Patient Details  Name: Chad Young MRN: [id number] Date of Birth: 23-May-1971  Transition of Care [facility]) CM/SW Contact  [name] Phone Number: [phone] 05/05/2020, 2:54 PM  Clinical Narrative:     CSW called and left voicemail for patient's God father [name] [phone].  Pt. Refusing to take medication.        Expected Discharge Plan and Services                                                 Social Determinants of Health (SDOH) Interventions    Readmission Risk Interventions No flowsheet data found.

## 2020-05-05 NOTE — Progress Notes (Signed)
Rama Anne Fu MD  Brief progress note Anjel Pardo 05/05/20 Volulntary status Met with him and ITT CW   Diagnosis   IDD  Bipolar mixed IED  Sibling discord  ODD /  Personality Disorder NOS   Patient remains on unit, pending possible discharge to God Father.  He is rude and his rapport is poor.  He refuses psych meds  His sister is not the guardian.   If God father agrees, he can be discharged there.    His MS is about the same His mood is angry edgy frustrated He is oppositional and argumentative passive aggressive and withholding  No active SI HI or plans  He says he does not want to harm the sister but does not want to go to her house either   Oriented to person place and part of date   Consciousness not clouded or fluctuant  No frank psychosis or mania  Afeb VSS --no other new medical problems or side effects    Awaits possible discharge home to God father  Otherwise will be disposition issue via SW       Janese Banks MD

## 2020-05-05 NOTE — ED Notes (Signed)
Pt given [place] sandwich and ginger ale with ice. No other needs voiced at this time; will continue to monitor Q15 minute rounds.

## 2020-05-05 NOTE — ED Notes (Signed)
Pt. Received his dinner tray and apple juice.

## 2020-05-05 NOTE — ED Notes (Signed)
Pt up to the bathroom

## 2020-05-05 NOTE — ED Notes (Signed)
Pt refusing his medications stating he only takes it at night. Attempted to educate pt but he still refuses to take it.

## 2020-05-05 NOTE — ED Provider Notes (Signed)
Emergency Medicine Observation Re-evaluation Note  Chad Young is a 49 y.o. male, seen on rounds today.  Pt initially presented to the ED for complaints of Mental Health Problem Currently, the patient is in NAD   Physical Exam  BP 138/89 (BP Location: Right Arm)   Pulse 76   Temp 98.1 F (36.7 C) (Oral)   Resp 18   Ht 5\' 8"  (1.727 m)   Wt 86 kg   SpO2 100%   BMI 28.83 kg/m  Physical Exam  ED Course / MDM  EKG:    I have reviewed the labs performed to date as well as medications administered while in observation.  Plan  Current plan is for placement Patient is not under full IVC at this time.   , MD 05/05/20 [phone]

## 2020-05-06 NOTE — ED Notes (Signed)
VOL  PENDING  PLACEMENT 

## 2020-05-06 NOTE — ED Provider Notes (Signed)
Emergency Medicine Observation Re-evaluation Note  Chad Young is a 49 y.o. male, seen on rounds today.  Pt initially presented to the ED for complaints of Mental Health Problem Currently, the patient is resting  Physical Exam  BP 137/90 (BP Location: Right Arm)   Pulse 76   Temp 98.3 F (36.8 C) (Oral)   Resp 16   Ht 5\' 8"  (1.727 m)   Wt 86 kg   SpO2 100%   BMI 28.83 kg/m  Physical Exam  ED Course / MDM  EKG:    I have reviewed the labs performed to date as well as medications administered while in observation.   Plan  Current plan is for psych dispo. Patient is not under full IVC at this time.   , MD 05/06/20 [phone]

## 2020-05-06 NOTE — ED Notes (Signed)
Pt given supper tray.

## 2020-05-06 NOTE — ED Notes (Signed)
Hourly rounding reveals patient asleep in room. No complaints, stable, in no acute distress. Q15 minute rounds and monitoring via Security Cameras to continue. 

## 2020-05-06 NOTE — ED Notes (Signed)
Sitting in day room  

## 2020-05-06 NOTE — ED Notes (Signed)
Hourly rounding reveals patient sleeping in room. No complaints, stable, in no acute distress. Q15 minute rounds and monitoring via Security Cameras to continue. 

## 2020-05-06 NOTE — ED Notes (Signed)
Hourly rounding reveals patient in day room. No complaints, stable, in no acute distress. Q15 minute rounds and monitoring via Security Cameras to continue. 

## 2020-05-06 NOTE — ED Notes (Signed)
Report to include Situation, Background, Assessment, and Recommendations received from Jeanette RN. Patient alert and oriented, warm and dry, in no acute distress. Patient denies SI, HI, AVH and pain. Patient made aware of Q15 minute rounds and security cameras for their safety. Patient instructed to come to me with needs or concerns.  

## 2020-05-06 NOTE — TOC Progression Note (Signed)
Transition of Care [facility]) - Progression Note    Patient Details  Name: Chad Young MRN: [id number] Date of Birth: 1971-04-26  Transition of Care [facility]) CM/SW Contact  [name] Phone Number: [phone] 05/06/2020, 2:26 PM  Clinical Narrative:     CSW spoke with patient's God father Chad Young [phone], and he stated the patient could come and stay with two weeks from now, when his daughter finished school. Chad Young stated he did not feel comfortable w/ the patient in the home, while there was a minor living there due to safety concerns.  Chad Young stated he has spoken with the patient and has told the patient that he needs to wait two weeks.       Expected Discharge Plan and Services                                                 Social Determinants of Health (SDOH) Interventions    Readmission Risk Interventions No flowsheet data found.

## 2020-05-06 NOTE — ED Notes (Signed)
Pt given lunch tray.

## 2020-05-06 NOTE — ED Notes (Signed)
Pt given breakfast food tray with lemon and lime soda.

## 2020-05-07 DIAGNOSIS — F3160 Bipolar disorder, current episode mixed, unspecified: Secondary | ICD-10-CM | POA: Diagnosis not present

## 2020-05-07 NOTE — ED Notes (Signed)
Hourly rounding reveals patient sleeping in room. No complaints, stable, in no acute distress. Q15 minute rounds and monitoring via Security Cameras to continue. 

## 2020-05-07 NOTE — ED Notes (Signed)
Meal tray given 

## 2020-05-07 NOTE — ED Notes (Signed)
VOL/Pending Placement 

## 2020-05-07 NOTE — ED Provider Notes (Signed)
Emergency Medicine Observation Re-evaluation Note  Chad Young is a 49 y.o. male, seen on rounds today.  Pt initially presented to the ED for complaints of Mental Health Problem Currently, the patient is sleeping.  Physical Exam  BP (!) 111/57 (BP Location: Right Arm)   Pulse 72   Temp 99.1 F (37.3 C) (Oral)   Resp 16   Ht 5\' 8"  (1.727 m)   Wt 86 kg   SpO2 99%   BMI 28.83 kg/m  Physical Exam  ED Course / MDM  EKG:    I have reviewed the labs performed to date as well as medications administered while in observation.  Recent changes in the last 24 hours include none. Plan  Current plan is for psych disposition. Patient is not under full IVC at this time.   , Don Perking, MD 05/07/20 [phone]

## 2020-05-08 NOTE — ED Notes (Signed)
Patient in day room. Refused shower. Snack and drink offered,

## 2020-05-08 NOTE — ED Provider Notes (Signed)
Emergency Medicine Observation Re-evaluation Note  Chad Young is a 49 y.o. male, seen on rounds today.  Pt initially presented to the ED for complaints of Mental Health Problem Currently, the patient is resting in no acute distress.  Physical Exam  BP 134/80 (BP Location: Left Arm)   Pulse 76   Temp 98.9 F (37.2 C) (Oral)   Resp 18   Ht 5\' 8"  (1.727 m)   Wt 86 kg   SpO2 99%   BMI 28.83 kg/m  Physical Exam  ED Course / MDM  EKG:    I have reviewed the labs performed to date as well as medications administered while in observation.  Recent changes in the last 24 hours include no events overnight. Plan  Current plan is for psychiatric disposition. Patient is not under full IVC at this time.   , MD 05/08/20 0530

## 2020-05-08 NOTE — ED Notes (Signed)
Hourly rounding reveals patient in room. No complaints, stable, in no acute distress. Q15 minute rounds and monitoring via Security Cameras to continue. 

## 2020-05-08 NOTE — ED Notes (Signed)
Pt given lunch meal tray with juice.

## 2020-05-08 NOTE — ED Notes (Signed)
Report to include Situation, Background, Assessment, and Recommendations received from Jeannette RN. Patient alert and oriented, warm and dry, in no acute distress. Patient denies SI, HI, AVH and pain. Patient made aware of Q15 minute rounds and security cameras for their safety. Patient instructed to come to me with needs or concerns.  

## 2020-05-08 NOTE — ED Notes (Signed)
patient refused scheduled AM meds.  Up for breakfast and went back to bed.

## 2020-05-08 NOTE — ED Notes (Signed)
dinner provided

## 2020-05-08 NOTE — ED Notes (Addendum)
Hourly rounding reveals patient in room. No complaints, stable, in no acute distress. Q15 minute rounds and monitoring via Security Cameras to continue. 

## 2020-05-09 DIAGNOSIS — F3160 Bipolar disorder, current episode mixed, unspecified: Secondary | ICD-10-CM | POA: Diagnosis not present

## 2020-05-09 NOTE — ED Provider Notes (Signed)
Emergency Medicine Observation Re-evaluation Note  AZAR SOUTH is a 49 y.o. male, seen on rounds today.  Pt initially presented to the ED for complaints of Mental Health Problem Currently, the patient is sleeping.  Physical Exam  BP 123/67 (BP Location: Right Arm)   Pulse 79   Temp (!) 97.4 F (36.3 C) (Oral)   Resp 18   Ht 5\' 8"  (1.727 m)   Wt 86 kg   SpO2 99%   BMI 28.83 kg/m  Physical Exam  ED Course / MDM  EKG:    I have reviewed the labs performed to date as well as medications administered while in observation.  Recent changes in the last 24 hours include none. Plan  Current plan is for psych dispo. Patient is not under full IVC at this time.   , Don Perking, MD 05/09/20 [phone]

## 2020-05-09 NOTE — ED Notes (Signed)
Hourly rounding reveals patient in room. No complaints, stable, in no acute distress. Q15 minute rounds and monitoring via Security Cameras to continue. 

## 2020-05-09 NOTE — ED Notes (Signed)
Pt denies SI/HI/AVH on assessment. Pt has to be encouraged to take his meds and questions every pill given. Eventually took his meds this morning.

## 2020-05-09 NOTE — ED Notes (Signed)
Patient is resting comfortably. No other needs found at this moment.  

## 2020-05-09 NOTE — ED Notes (Signed)
Hourly rounding reveals patient sleeping in room. No complaints, stable, in no acute distress. Q15 minute rounds and monitoring via Security Cameras to continue. 

## 2020-05-09 NOTE — ED Notes (Signed)
Pt given meal tray and a cup of sprite.  

## 2020-05-09 NOTE — ED Notes (Signed)
Hourly rounding reveals patient awake in room. No complaints, stable, in no acute distress. Q15 minute rounds and monitoring via Security Cameras to continue. 

## 2020-05-10 DIAGNOSIS — F3160 Bipolar disorder, current episode mixed, unspecified: Secondary | ICD-10-CM | POA: Diagnosis not present

## 2020-05-10 NOTE — ED Notes (Signed)
Pt asleep, lunch tray placed on chair in rm. 

## 2020-05-10 NOTE — TOC Progression Note (Addendum)
Transition of Care [facility]) - Progression Note    Patient Details  Name: Chad Young MRN: [id number] Date of Birth: 01/30/1971  Transition of Care [facility]) CM/SW Contact  Choctaw Cellar, RN Phone Number: 05/10/2020, 12:40 PM  Clinical Narrative:    Patient was talking to group home administrator visiting another patient. States he is interested in placement as well. Administrator, Lorretta Harp talked with patient for a moment and agreed to review his paperwork. RN CM will review insurance and potential placement.          Expected Discharge Plan and Services                                                 Social Determinants of Health (SDOH) Interventions    Readmission Risk Interventions No flowsheet data found.

## 2020-05-10 NOTE — ED Provider Notes (Signed)
Emergency Medicine Observation Re-evaluation Note  Chad Young is a 49 y.o. male, seen on rounds today.  Pt initially presented to the ED for complaints of Mental Health Problem Currently, the patient is sleeping.  Physical Exam  BP (!) 122/99 (BP Location: Left Arm)   Pulse 77   Temp 98.5 F (36.9 C) (Oral)   Resp 18   Ht 1.727 m (5\' 8" )   Wt 86 kg   SpO2 99%   BMI 28.83 kg/m  Physical Exam  ED Course / MDM  EKG:    I have reviewed the labs performed to date as well as medications administered while in observation.  Recent changes in the last 24 hours include nothing clinically relevant. Plan  Current plan is for psych and/or social work disposition. Patient is not under full IVC at this time.   , MD 05/10/20 [phone]

## 2020-05-10 NOTE — ED Notes (Signed)
VOL  PENDING  PLACEMENT 

## 2020-05-10 NOTE — ED Notes (Signed)
VOL/Pending Placement 

## 2020-05-10 NOTE — ED Notes (Signed)
Pt given meal tray.

## 2020-05-10 NOTE — ED Notes (Signed)
Pt given supplies to shower. Pt is able to shower independently.  

## 2020-05-10 NOTE — ED Notes (Signed)
Denies SI/HI/HV. Awaiting placement. Will encourage bath today. Otherwise calm and cooperative. Safety maintained.

## 2020-05-10 NOTE — ED Notes (Signed)
Pt given breakfast tray

## 2020-05-10 NOTE — ED Notes (Signed)
Pt offered a shower but refused at this time. Pt stated "not right now, I will take one later on." Pt informed to let this tech know when he is ready for a shower.

## 2020-05-11 DIAGNOSIS — F3160 Bipolar disorder, current episode mixed, unspecified: Secondary | ICD-10-CM | POA: Diagnosis not present

## 2020-05-11 NOTE — NC FL2 (Signed)
  [place] MEDICAID FL2 LEVEL OF CARE SCREENING TOOL     IDENTIFICATION  Patient Name: Chad Young Birthdate: 08/02/1971 Sex: male Admission Date (Current Location): 05/02/2020  [place] and [place] Number:  [address] [id number] [facility] Facility and Address:  [facility], [address] [place], [place] [address]      Provider Number: [id number]  Attending Physician Name and Address:  No att. providers found  Relative Name and Phone Number:  [name] [phone]    Current Level of Care: Hospital Recommended Level of Care: Other (Comment)(Group Home) Prior Approval Number:    Date Approved/Denied:   PASRR Number:    Discharge Plan: Other (Comment)([facility])    Current Diagnoses: Patient Active Problem List   Diagnosis Date Noted  . Anger   . Mental and behavioral problem in adult   . Aggression 08/07/2019  . Bipolar affective disorder, current episode mixed (HCC) 08/07/2019  . Intellectual disability 11/10/2018  . Adjustment disorder with mixed disturbance of emotions and conduct 11/10/2018  . Homicidal ideation 11/10/2018    Orientation RESPIRATION BLADDER Height & Weight     Self, Situation, Place  Normal Continent Weight: 189 lb 9.5 oz (86 kg) Height:  5\' 8"  (172.7 cm)  BEHAVIORAL SYMPTOMS/MOOD NEUROLOGICAL BOWEL NUTRITION STATUS  Other (Comment)(Patient made verbal threats to sister)   Continent Diet  AMBULATORY STATUS COMMUNICATION OF NEEDS Skin   Independent Verbally Normal                       Personal Care Assistance Level of Assistance              Functional Limitations Info             SPECIAL CARE FACTORS FREQUENCY                       Contractures Contractures Info: Not present    Additional Factors Info                  Current Medications (05/11/2020):  This is the current hospital active medication list Current Facility-Administered Medications  Medication Dose Route Frequency  Provider Last Rate Last Admin  . ARIPiprazole (ABILIFY) tablet 5 mg  5 mg Oral Daily 07/11/2020, MD   5 mg at 05/09/20 0945  . benztropine (COGENTIN) tablet 0.5 mg  0.5 mg Oral BID 05/11/20, MD   0.5 mg at 05/10/20 2151  . sertraline (ZOLOFT) tablet 50 mg  50 mg Oral Daily 2152, NP   50 mg at 05/11/20 1024   Current Outpatient Medications  Medication Sig Dispense Refill  . sertraline (ZOLOFT) 50 MG tablet Take 50 mg by mouth daily.       Discharge Medications: Please see discharge summary for a list of discharge medications.  Relevant Imaging Results:  Relevant Lab Results:   Additional Information SS# 07/11/20, patient also has Medicare  [id number], LCSWA

## 2020-05-11 NOTE — TOC Progression Note (Addendum)
Transition of Care [facility]) - Progression Note    Patient Details  Name: Chad Young MRN: [id number] Date of Birth: 1971-10-12  Transition of Care [facility]) CM/SW Contact  Joseph Art, [place] Phone Number: 05/11/2020, 4:44 PM  Clinical Narrative:     CSW faxed FL2 to [facility] [phone], for possible bed offer.  Expected Discharge Plan: Group Home Barriers to Discharge: No Barriers Identified  Expected Discharge Plan and Services Expected Discharge Plan: Group Home In-house Referral: Clinical Social Work     Living arrangements for the past 2 months: Single Family Home                                       Social Determinants of Health (SDOH) Interventions    Readmission Risk Interventions No flowsheet data found.

## 2020-05-11 NOTE — ED Notes (Signed)
VOL/Pending Placement 

## 2020-05-11 NOTE — ED Provider Notes (Signed)
Emergency Medicine Observation Re-evaluation Note  Chad Young is a 49 y.o. male, seen on rounds today.  Pt initially presented to the ED for complaints of Mental Health Problem Currently, the patient is sleeping.  Physical Exam  BP 116/75 (BP Location: Left Arm)   Pulse 76   Temp 97.9 F (36.6 C) (Oral)   Resp 20   Ht 1.727 m (5\' 8" )   Wt 86 kg   SpO2 99%   BMI 28.83 kg/m  Physical Exam  ED Course / MDM  EKG:    I have reviewed the labs performed to date as well as medications administered while in observation.  Recent changes in the last 24 hours include nothing clinically relevant. Plan  Current plan is for psychiatric versus social work disposition. Patient is not under full IVC at this time.   , MD 05/11/20 [phone]

## 2020-05-12 DIAGNOSIS — F3160 Bipolar disorder, current episode mixed, unspecified: Secondary | ICD-10-CM | POA: Diagnosis not present

## 2020-05-12 NOTE — ED Notes (Signed)
VOL  PENDING  PLACEMENT 

## 2020-05-12 NOTE — ED Notes (Signed)
Meal tray provided.

## 2020-05-12 NOTE — TOC Progression Note (Signed)
Transition of Care [facility]) - Progression Note    Patient Details  Name: Chad Young MRN: [id number] Date of Birth: [birth year]  Transition of Care [facility]) CM/SW Contact  [place] Cellar, RN Phone Number: 05/12/2020, 3:40 PM  Clinical Narrative:    Patient interviewed by [facility] for placement and patient refused to go stating he was "going to his Godfather's house" and was not going to a group home because he was always bullied at the group homes. Mr. Lorretta Harp, administrator talked at great detail with the patient and updated that if that discharge did not work out they would be happy to talk with him again about placement.    Expected Discharge Plan: Group Home Barriers to Discharge: No Barriers Identified  Expected Discharge Plan and Services Expected Discharge Plan: Group Home In-house Referral: Clinical Social Work     Living arrangements for the past 2 months: Single Family Home                                       Social Determinants of Health (SDOH) Interventions    Readmission Risk Interventions No flowsheet data found.

## 2020-05-13 DIAGNOSIS — F3160 Bipolar disorder, current episode mixed, unspecified: Secondary | ICD-10-CM | POA: Diagnosis not present

## 2020-05-13 NOTE — ED Provider Notes (Signed)
Emergency Medicine Observation Re-evaluation Note  Chad Young is a 49 y.o. male, seen on rounds today.  Pt initially presented to the ED for complaints of Mental Health Problem Currently, the patient is resting in no acute distress.  Physical Exam  BP 122/84 (BP Location: Left Arm)   Pulse 74   Temp 97.8 F (36.6 C) (Oral)   Resp 16   Ht 5\' 8"  (1.727 m)   Wt 86 kg   SpO2 100%   BMI 28.83 kg/m  Physical Exam  ED Course / MDM  EKG:    I have reviewed the labs performed to date as well as medications administered while in observation.  Recent changes in the last 24 hours include no events overnight. Plan  Current plan is for pending placement. Patient is not under full IVC at this time.   , MD 05/13/20 [phone]

## 2020-05-13 NOTE — ED Notes (Signed)
VOL/Pending Placement 

## 2020-05-13 NOTE — ED Notes (Signed)
Pt denies SI/HI/AVH on assessment. Pt refused medications saying he only takes it at night. Attempted to educate pt but he still refused to take them.

## 2020-05-13 NOTE — TOC Progression Note (Addendum)
Transition of Care [facility]) - Progression Note    Patient Details  Name: Chad Young MRN: [id number] Date of Birth: [birth year]  Transition of Care [facility]) CM/SW Contact  [name] Phone Number: [phone] 05/13/2020, 9:49 AM  Clinical Narrative:     Spoke with patient's [name] [phone], he stated he would be happy to have the patient stay in his home, but he needs to be certain he will receive a monthly stipend from [place] every month to help with expenses.  This CSW called [name] [phone] the patient's sister and she stated she was not going to send a stipend to [name] and "going there is not in [name]."  This CSW informed [name] the patient is legally able to make his own decisions and if he wants to go to [place] house we cannot stop him.  This CSW also explained to [name] the patient was offered a group home placement and he refused because he wanted to go to [name].  [name] stated she would contact the patient's father and she would call this CSW back today.   Expected Discharge Plan: Group Home Barriers to Discharge: No Barriers Identified  Expected Discharge Plan and Services Expected Discharge Plan: Group Home In-house Referral: Clinical Social Work     Living arrangements for the past 2 months: Single Family Home                                       Social Determinants of Health (SDOH) Interventions    Readmission Risk Interventions No flowsheet data found.

## 2020-05-13 NOTE — TOC Progression Note (Addendum)
Transition of Care [facility]) - Progression Note    Patient Details  Name: Chad Young MRN: [id number] Date of Birth: [birth year]  Transition of Care [facility]) CM/SW Contact  [name] Phone Number: [phone] 05/13/2020, 3:15 PM  Clinical Narrative:     CSW spoke with patient's sister [name] and she stated the patient will go to his [employer], [name], but he will not be able to take him in until next week.  This CSW called [name] to confirm and was unable to leave a voicemail; voicemail not set up.  Expected Discharge Plan: Group Home Barriers to Discharge: No Barriers Identified  Expected Discharge Plan and Services Expected Discharge Plan: Group Home In-house Referral: Clinical Social Work     Living arrangements for the past 2 months: Single Family Home                                       Social Determinants of Health (SDOH) Interventions    Readmission Risk Interventions No flowsheet data found.

## 2020-05-14 MED ORDER — BENZTROPINE MESYLATE 1 MG PO TABS
1.0000 mg | ORAL_TABLET | Freq: Every day | ORAL | Status: DC
  Administered 2020-05-15 – 2020-05-19 (×5): 1 mg via ORAL
  Filled 2020-05-14 (×5): qty 1

## 2020-05-14 MED ORDER — SERTRALINE HCL 50 MG PO TABS
50.0000 mg | ORAL_TABLET | Freq: Every day | ORAL | Status: DC
  Administered 2020-05-15 – 2020-05-19 (×5): 50 mg via ORAL
  Filled 2020-05-14 (×6): qty 1

## 2020-05-14 MED ORDER — ARIPIPRAZOLE 5 MG PO TABS: 5.0000 mg | ORAL_TABLET | Freq: Every day | ORAL | Status: DC

## 2020-05-14 MED ORDER — ARIPIPRAZOLE 5 MG PO TABS
5.0000 mg | ORAL_TABLET | Freq: Every day | ORAL | Status: DC
  Administered 2020-05-15 – 2020-05-19 (×5): 5 mg via ORAL
  Filled 2020-05-14 (×6): qty 1

## 2020-05-14 NOTE — ED Notes (Signed)
Hourly rounding reveals patient in room. No complaints, stable, in no acute distress. Q15 minute rounds and monitoring via Security Cameras to continue. 

## 2020-05-14 NOTE — ED Notes (Signed)
Pt given meal tray.

## 2020-05-14 NOTE — ED Notes (Signed)
Report to include Situation, Background, Assessment, and Recommendations received from Sarah RN. Patient alert and oriented, warm and dry, in no acute distress. Patient denies SI, HI, AVH and pain. Patient made aware of Q15 minute rounds and security cameras for their safety. Patient instructed to come to me with needs or concerns.  

## 2020-05-14 NOTE — Progress Notes (Signed)
Writer advised by staff nurse (ANA) to reschedule patients meds to night time. Adjusted his cogentin to 1mg  qhs.

## 2020-05-14 NOTE — ED Notes (Signed)
Pt out on the unit to use the bathroom, no distress noted cont to monitor

## 2020-05-14 NOTE — ED Provider Notes (Signed)
Emergency Medicine Observation Re-evaluation Note  Chad Young is a 49 y.o. male, seen on rounds today.  Pt initially presented to the ED for complaints of Mental Health Problem Currently, the patient is in no acute distress.  Physical Exam  BP 115/67 (BP Location: Left Arm)   Pulse 64   Temp 98.5 F (36.9 C) (Oral)   Resp 18   Ht 5\' 8"  (1.727 m)   Wt 86 kg   SpO2 100%   BMI 28.83 kg/m  Physical Exam  ED Course / MDM  EKG:    I have reviewed the labs performed to date as well as medications administered while in observation.  Recent changes in the last 24 hours include no acute events. Plan  Current plan is for pending placement. Patient is not under full IVC at this time.   , MD 05/14/20 [phone]

## 2020-05-15 DIAGNOSIS — F3160 Bipolar disorder, current episode mixed, unspecified: Secondary | ICD-10-CM | POA: Diagnosis not present

## 2020-05-15 NOTE — ED Notes (Signed)
Hourly rounding reveals patient in room. No complaints, stable, in no acute distress. Q15 minute rounds and monitoring via Security Cameras to continue. 

## 2020-05-15 NOTE — ED Notes (Signed)
Pt given another blanket when he handed phone back to this tech.

## 2020-05-15 NOTE — ED Notes (Signed)
Pt given phone 

## 2020-05-15 NOTE — ED Notes (Signed)
Pt given dinner tray and apple juice °

## 2020-05-15 NOTE — ED Provider Notes (Signed)
Emergency Medicine Observation Re-evaluation Note  Chad Young is a 49 y.o. male, seen on rounds today.  Pt initially presented to the ED for complaints of Mental Health Problem Currently, the patient is resting in no acute distress.  Physical Exam  BP 122/82   Pulse 82   Temp 97.9 F (36.6 C) (Oral)   Resp 16   Ht 5\' 8"  (1.727 m)   Wt 86 kg   SpO2 99%   BMI 28.83 kg/m  Physical Exam  ED Course / MDM  EKG:    I have reviewed the labs performed to date as well as medications administered while in observation.  Recent changes in the last 24 hours include no events overnight. Plan  Current plan is for placement. Patient is not under full IVC at this time.   , MD 05/15/20 [phone]

## 2020-05-16 DIAGNOSIS — F3160 Bipolar disorder, current episode mixed, unspecified: Secondary | ICD-10-CM | POA: Diagnosis not present

## 2020-05-16 NOTE — ED Notes (Signed)
Pt denies SI/HI/AVH on assessment. Continues to await placement.

## 2020-05-16 NOTE — TOC Progression Note (Signed)
Transition of Care [facility]) - Progression Note    Patient Details  Name: Chad Young MRN: [id number] Date of Birth: [birth year]  Transition of Care [facility]) CM/SW Contact  Joseph Art, [place] Phone Number: [phone] 05/16/2020, 2:15 PM  Clinical Narrative:     CSW spoke with patient's Chad Young [phone], and he confirmed the patient will be abel to live with him, but he will not be abel to take the patient until Friday 05/20/2020 after 6:00PM.    Expected Discharge Plan: Group Home Barriers to Discharge: No Barriers Identified  Expected Discharge Plan and Services Expected Discharge Plan: Group Home In-house Referral: Clinical Social Work     Living arrangements for the past 2 months: Single Family Home                                       Social Determinants of Health (SDOH) Interventions    Readmission Risk Interventions No flowsheet data found.

## 2020-05-16 NOTE — ED Provider Notes (Signed)
Emergency Medicine Observation Re-evaluation Note  Chad Young is a 49 y.o. male, seen on rounds today.  Pt initially presented to the ED for complaints of Mental Health Problem Currently, the patient is resting.  Physical Exam  BP 109/67 (BP Location: Left Arm)   Pulse 70   Temp 98.4 F (36.9 C) (Oral)   Resp 16   Ht 5\' 8"  (1.727 m)   Wt 86 kg   SpO2 97%   BMI 28.83 kg/m  Physical Exam  ED Course / MDM  EKG:    I have reviewed the labs performed to date as well as medications administered while in observation.  Recent changes in the last 24 hours include none. Plan  Current plan is for psych SW CM dispo. Patient is under full IVC at this time.   , MD 05/16/20 [phone]

## 2020-05-17 DIAGNOSIS — F3160 Bipolar disorder, current episode mixed, unspecified: Secondary | ICD-10-CM | POA: Diagnosis not present

## 2020-05-17 NOTE — ED Provider Notes (Signed)
Emergency Medicine Observation Re-evaluation Note  DRAKE WUERTZ is a 49 y.o. male, seen on rounds today.  Pt initially presented to the ED for complaints of Mental Health Problem Currently, the patient is sleeping.  Physical Exam  BP 118/88 (BP Location: Left Arm)   Pulse 78   Temp 98.9 F (37.2 C) (Oral)   Resp 17   Ht 5\' 8"  (1.727 m)   Wt 86 kg   SpO2 100%   BMI 28.83 kg/m  Physical Exam  ED Course / MDM  EKG:    I have reviewed the labs performed to date as well as medications administered while in observation.  Recent changes in the last 24 hours include none. Plan  Current plan is for psych disposition. Patient is not under full IVC at this time.   , Don Perking, MD 05/17/20 [phone]

## 2020-05-17 NOTE — ED Notes (Signed)
VOL/Pending placement 

## 2020-05-17 NOTE — ED Notes (Signed)
Patient up to nursing station, He is calm and cooperative, safe, will continue to monitor, no Si/hi or avh noted.

## 2020-05-17 NOTE — ED Notes (Signed)
Pt given meal tray and a juice.

## 2020-05-17 NOTE — ED Notes (Signed)
VOL  PENDING  PLACEMENT 

## 2020-05-18 DIAGNOSIS — F3160 Bipolar disorder, current episode mixed, unspecified: Secondary | ICD-10-CM | POA: Diagnosis not present

## 2020-05-18 NOTE — ED Notes (Signed)

## 2020-05-18 NOTE — ED Notes (Signed)
Pt. Alert and oriented, warm and dry, in no distress. Pt. Denies SI, HI, and AVH. Pt. Encouraged to let nursing staff know of any concerns or needs. 

## 2020-05-18 NOTE — ED Provider Notes (Signed)
Emergency Medicine Observation Re-evaluation Note  Chad Young is a 49 y.o. male, seen on rounds today.  Pt initially presented to the ED for complaints of Mental Health Problem Currently, the patient is resting.  Physical Exam  BP (!) 132/91 (BP Location: Right Arm)   Pulse 83   Temp 98.9 F (37.2 C) (Oral)   Resp 17   Ht 5\' 8"  (1.727 m)   Wt 86 kg   SpO2 100%   BMI 28.83 kg/m  Physical Exam  ED Course / MDM  EKG:    I have reviewed the labs performed to date as well as medications administered while in observation.  Recent changes in the last 24 hours include none. Plan  Current plan is for psych eval. Patient is under full IVC at this time.   , MD 05/18/20 [phone]

## 2020-05-18 NOTE — ED Notes (Signed)
Patient given a snack 

## 2020-05-19 DIAGNOSIS — F3160 Bipolar disorder, current episode mixed, unspecified: Secondary | ICD-10-CM | POA: Diagnosis not present

## 2020-05-19 NOTE — ED Notes (Signed)
Patient given a snack pack  

## 2020-05-19 NOTE — ED Provider Notes (Signed)
Emergency Medicine Observation Re-evaluation Note  Chad Young is a 49 y.o. male, seen on rounds today.  Pt initially presented to the ED for complaints of Mental Health Problem Currently, the patient is resting after eating snack.  Physical Exam  BP 110/72 (BP Location: Right Arm)    Pulse 86    Temp 97.7 F (36.5 C) (Oral)    Resp 20    Ht 5\' 8"  (1.727 m)    Wt 86 kg    SpO2 98%    BMI 28.83 kg/m  Physical Exam   Constitutional: Resting comfortably. Eyes: Conjunctivae are normal. Head: Atraumatic. Nose: No congestion/rhinnorhea. Mouth/Throat: Mucous membranes are moist. Neck: Normal ROM Cardiovascular: No cyanosis noted. Respiratory: Normal respiratory effort. Gastrointestinal: Non-distended. Genitourinary: deferred Musculoskeletal: No lower extremity tenderness nor edema. Neurologic:  Normal speech and language. No gross focal neurologic deficits are appreciated. Skin:  Skin is warm, dry and intact. No rash noted.   ED Course / MDM  EKG:    I have reviewed the labs performed to date as well as medications administered while in observation.  Recent changes in the last 24 hours include none. Plan  Current plan is for psych disposition and social work. Patient is not under full IVC at this time.   , MD 05/19/20 [phone]

## 2020-05-20 MED ORDER — ARIPIPRAZOLE 5 MG PO TABS: 5.0000 mg | ORAL_TABLET | Freq: Every day | ORAL | 1 refills | Status: AC

## 2020-05-20 MED ORDER — BENZTROPINE MESYLATE 1 MG PO TABS
1.0000 mg | ORAL_TABLET | Freq: Every day | ORAL | 1 refills | Status: AC
Start: 2020-05-20 — End: 2021-05-20

## 2020-05-20 MED ORDER — SERTRALINE HCL 50 MG PO TABS
50.0000 mg | ORAL_TABLET | Freq: Every day | ORAL | 1 refills | Status: AC
Start: 2020-05-20 — End: 2021-05-20

## 2020-05-20 NOTE — ED Notes (Signed)
SW called for update on patients discharge to home with god father. Will contact the god father and let me know when patient is cleared to be sent home in a cab.

## 2020-05-20 NOTE — ED Notes (Signed)
Patient up early requesting shower. Clean clothes and oral and hygiene supplies provided.

## 2020-05-20 NOTE — ED Notes (Addendum)
safe transport here to pick patient patient. Clothing given and personal belongings returned to patient.

## 2020-05-20 NOTE — TOC Transition Note (Addendum)
Transition of Care [facility]) - CM/SW Discharge Note   Patient Details  Name: Chad Young MRN: [id number] Date of Birth: 04-07-1971  [facility] CM/SW Contact:  Joseph Art, LCSWA Phone Number: 05/20/2020, 2:22 PM   Clinical Narrative:    CSW spoke with patient's [name] [phone], and this CSW confirmed the patient will be going to his home.  [name] stated yes.  Patient will transport via [facility] transportation services [phone], patient will need to be ready at 5:00PM, address is [address], [place].  EDP/EDStaff notified.   Final next level of care: Group Home Barriers to Discharge: No Barriers Identified   Patient Goals and CMS Choice Patient states their goals for this hospitalization and ongoing recovery are:: Go to a group home CMS Medicare.gov Compare Post Acute Care list provided to:: Other (Comment Required) (Pts sister, [name] [phone]) Choice offered to / list presented to : Sibling  Discharge Placement                       Discharge Plan and Services In-house Referral: Clinical Social Work                                   Social Determinants of Health (SDOH) Interventions     Readmission Risk Interventions No flowsheet data found.

## 2020-05-20 NOTE — ED Notes (Signed)
Assumed care of patient.patient slept well through the night. Denies any issues or concerns. Patient aware that he will be discharged to uncles custody sometime today. Awaiting SW to verify discharge plans. Safety maintained. Will monitor.

## 2020-05-20 NOTE — ED Provider Notes (Signed)
Patient to be discharged today to godfather's. Discussed with Dr. Smith Robert with psychiatry who did recommend continuing medication. Will prepare discharge paperwork and prescriptions.   Phineas Semen, MD 05/20/20 1710

## 2020-05-20 NOTE — ED Notes (Signed)
Pt given supplies for shower and clean scrubs. °

## 2020-05-20 NOTE — Discharge Instructions (Addendum)
Please seek medical attention and help for any thoughts about wanting to harm yourself, harm others, any concerning change in behavior, severe depression, inappropriate drug use or any other new or concerning symptoms. ° °

## 2020-05-20 NOTE — ED Provider Notes (Signed)
Emergency Medicine Observation Re-evaluation Note  Chad Young is a 49 y.o. male, seen on rounds today.  Pt initially presented to the ED for complaints of suicidal homicidal ideation. Patient has a fairly significant history of intellectual disability. Patient was living at his sister's house but states if he has to go back there he will hurt himself or someone else.  Physical Exam  BP 120/68 (BP Location: Right Arm)   Pulse 76   Temp 98.3 F (36.8 C) (Oral)   Resp 17   Ht 5\' 8"  (1.727 m)   Wt 86 kg   SpO2 100%   BMI 28.83 kg/m  I have personally seen and evaluated the patient this morning. Overall he appears quite well. No distress and has no medical complaints.  General: Patient has a positive demeanor this morning. He is awake and alert. No distress. Calm and cooperative. Neurological: Patient is able to move extremities, ambulating without issue. No gross deficits. Psychiatric: Again overall positive demeanor. No SI or HI. Patient excited about the possibility of living with his godfather.  ED Course / MDM  I have personally reviewed patient's lab work which is overall largely nonrevealing. Urine drug screen negative.  Patient appears to be doing well. Per social work notes the plan is for the patient to live with his . We are hoping that the patient will be able to be discharged into the godfather's care later today after 6 PM. Patient appears to be doing well currently i the behavioral health unit. No distress and with no medical complaints. Plan  Current plan of care is for the patient to be discharged into the godfather's care possibly as early as 6 PM today.   [name], MD 05/20/20 1022

## 2020-06-14 ENCOUNTER — Emergency Department
Admission: EM | Admit: 2020-06-14 | Discharge: 2020-07-05 | Disposition: A | Discharge status: Home / Self Care | Payer: Medicare Other | Attending: Emergency Medicine | Admitting: Emergency Medicine

## 2020-06-14 ENCOUNTER — Other Ambulatory Visit: Payer: Self-pay

## 2020-06-14 ENCOUNTER — Encounter: Payer: Self-pay | Admitting: Emergency Medicine

## 2020-06-14 DIAGNOSIS — R45850 Homicidal ideations: Secondary | ICD-10-CM | POA: Insufficient documentation

## 2020-06-14 DIAGNOSIS — F4325 Adjustment disorder with mixed disturbance of emotions and conduct: Secondary | ICD-10-CM | POA: Diagnosis present

## 2020-06-14 DIAGNOSIS — R4689 Other symptoms and signs involving appearance and behavior: Secondary | ICD-10-CM | POA: Diagnosis present

## 2020-06-14 DIAGNOSIS — I2510 Atherosclerotic heart disease of native coronary artery without angina pectoris: Secondary | ICD-10-CM | POA: Diagnosis not present

## 2020-06-14 DIAGNOSIS — F4323 Adjustment disorder with mixed anxiety and depressed mood: Secondary | ICD-10-CM | POA: Diagnosis not present

## 2020-06-14 DIAGNOSIS — R454 Irritability and anger: Secondary | ICD-10-CM | POA: Diagnosis present

## 2020-06-14 DIAGNOSIS — F69 Unspecified disorder of adult personality and behavior: Secondary | ICD-10-CM | POA: Diagnosis present

## 2020-06-14 DIAGNOSIS — F78 Other intellectual disabilities: Secondary | ICD-10-CM | POA: Insufficient documentation

## 2020-06-14 DIAGNOSIS — Z20822 Contact with and (suspected) exposure to covid-19: Secondary | ICD-10-CM | POA: Diagnosis not present

## 2020-06-14 DIAGNOSIS — Z03818 Encounter for observation for suspected exposure to other biological agents ruled out: Secondary | ICD-10-CM | POA: Diagnosis not present

## 2020-06-14 DIAGNOSIS — F918 Other conduct disorders: Secondary | ICD-10-CM | POA: Insufficient documentation

## 2020-06-14 DIAGNOSIS — F3130 Bipolar disorder, current episode depressed, mild or moderate severity, unspecified: Secondary | ICD-10-CM | POA: Insufficient documentation

## 2020-06-14 DIAGNOSIS — F79 Unspecified intellectual disabilities: Secondary | ICD-10-CM

## 2020-06-14 DIAGNOSIS — F3160 Bipolar disorder, current episode mixed, unspecified: Secondary | ICD-10-CM | POA: Diagnosis present

## 2020-06-14 LAB — COMPREHENSIVE METABOLIC PANEL
ALT: 21 U/L (ref 0–44)
AST: 28 U/L (ref 15–41)
Albumin: 4.1 g/dL (ref 3.5–5.0)
Alkaline Phosphatase: 98 U/L (ref 38–126)
Anion gap: 8 (ref 5–15)
BUN: 13 mg/dL (ref 6–20)
CO2: 29 mmol/L (ref 22–32)
Calcium: 9.2 mg/dL (ref 8.9–10.3)
Chloride: 104 mmol/L (ref 98–111)
Creatinine, Ser: 1.06 mg/dL (ref 0.61–1.24)
GFR calc Af Amer: >60 mL/min (ref >60)
GFR calc non Af Amer: >60 mL/min (ref >60)
Glucose, Bld: 84 mg/dL (ref 70–99)
Potassium: 4 mmol/L (ref 3.5–5.1)
Sodium: 141 mmol/L (ref 135–145)
Total Bilirubin: 0.8 mg/dL (ref 0.3–1.2)
Total Protein: 8.1 g/dL (ref 6.5–8.1)

## 2020-06-14 LAB — CBC
HCT: 42.4 % (ref 39.0–52.0)
Hemoglobin: 14.2 g/dL (ref 13.0–17.0)
MCH: 30.5 pg (ref 26.0–34.0)
MCHC: 33.5 g/dL (ref 30.0–36.0)
MCV: 91 fL (ref 80.0–100.0)
Platelets: 393 10*3/uL (ref 150–400)
RBC: 4.66 MIL/uL (ref 4.22–5.81)
RDW: 12.8 % (ref 11.5–15.5)
WBC: 5.9 10*3/uL (ref 4.0–10.5)
nRBC: 0 % (ref 0.0–0.2)

## 2020-06-14 LAB — URINE DRUG SCREEN, QUALITATIVE (ARMC ONLY)
Amphetamines, Ur Screen: NOT DETECTED
Barbiturates, Ur Screen: NOT DETECTED
Benzodiazepine, Ur Scrn: NOT DETECTED
Cannabinoid 50 Ng, Ur ~~LOC~~: NOT DETECTED
Cocaine Metabolite,Ur ~~LOC~~: NOT DETECTED
MDMA (Ecstasy)Ur Screen: NOT DETECTED
Methadone Scn, Ur: NOT DETECTED
Opiate, Ur Screen: NOT DETECTED
Phencyclidine (PCP) Ur S: NOT DETECTED
Tricyclic, Ur Screen: NOT DETECTED

## 2020-06-14 LAB — SARS CORONAVIRUS 2 BY RT PCR (HOSPITAL ORDER, PERFORMED IN ~~LOC~~ HOSPITAL LAB): SARS Coronavirus 2: NEGATIVE

## 2020-06-14 LAB — ETHANOL: Alcohol, Ethyl (B): <10 mg/dL (ref <10)

## 2020-06-14 LAB — ACETAMINOPHEN LEVEL: Acetaminophen (Tylenol), Serum: <10 ug/mL — ABNORMAL LOW (ref 10–30)

## 2020-06-14 LAB — SALICYLATE LEVEL: Salicylate Lvl: <7 mg/dL — ABNORMAL LOW (ref 7.0–30.0)

## 2020-06-14 MED ORDER — ARIPIPRAZOLE 5 MG PO TABS
5.0000 mg | ORAL_TABLET | Freq: Every day | ORAL | Status: DC
  Administered 2020-06-15 – 2020-07-04 (×18): 5 mg via ORAL
  Filled 2020-06-14 (×21): qty 1

## 2020-06-14 MED ORDER — BENZTROPINE MESYLATE 1 MG PO TABS
1.0000 mg | ORAL_TABLET | Freq: Every day | ORAL | Status: DC
  Administered 2020-06-15 – 2020-07-04 (×17): 1 mg via ORAL
  Filled 2020-06-14 (×18): qty 1

## 2020-06-14 MED ORDER — SERTRALINE HCL 50 MG PO TABS
50.0000 mg | ORAL_TABLET | Freq: Every day | ORAL | Status: DC
  Administered 2020-06-15 – 2020-07-04 (×19): 50 mg via ORAL
  Filled 2020-06-14 (×19): qty 1

## 2020-06-14 NOTE — ED Notes (Addendum)
Pt dressed out into burgundy scrubs with this tech, Vernona Rieger, EDT and [employer] in the rm. Pt belongings consist of $9.30 in cash counted by Vernona Rieger and verified by pt, black wallet, watch, cell phone, green shorts, green shirt, black shoes, white/grey socks, a pink mask and blue/white plaid boxers. Pt calm and cooperative while dressing out. Pt belongings placed into a pt belongings bag and labeled with pt name. Pt has one bag of belongings.

## 2020-06-14 NOTE — ED Notes (Signed)

## 2020-06-14 NOTE — ED Provider Notes (Signed)
[facility] Emergency Department Provider Note   ____________________________________________   First MD Initiated Contact with Patient 06/14/20 1829     (approximate)  I have reviewed the triage vital signs and the nursing notes.   HISTORY  Chief Complaint Mad at my sister   HPI Chad AVETISYAN is a 49 y.o. male   history of borderline diabetes anger problems  Patient reports that he has been upset with his sister.  He did make a threat to hurt her, but he reports that he was not actually going to do it he was just saying it out of anger.  He advises right now he still upset with his sister but does not want to hurt her.  Reports he would not do anything such as attacker or stab her.  He reports he just feels upset  No recent illness.  No fevers or chills.  Ports that he recently moved about a month ago and does not live with his sister  Past Medical History:  Diagnosis Date  . Borderline diabetes   . Coronary artery disease   . Excessive anger     Patient Active Problem List   Diagnosis Date Noted  . Anger   . Mental and behavioral problem in adult   . Aggression 08/07/2019  . Bipolar affective disorder, current episode mixed (HCC) 08/07/2019  . Intellectual disability 11/10/2018  . Adjustment disorder with mixed disturbance of emotions and conduct 11/10/2018  . Homicidal ideation 11/10/2018    History reviewed. No pertinent surgical history.  Prior to Admission medications   Medication Sig Start Date End Date Taking? Authorizing Provider  ARIPiprazole (ABILIFY) 5 MG tablet Take 1 tablet (5 mg total) by mouth at bedtime. 05/20/20   Phineas Semen, MD  benztropine (COGENTIN) 1 MG tablet Take 1 tablet (1 mg total) by mouth at bedtime. 05/20/20 05/20/21  Phineas Semen, MD  sertraline (ZOLOFT) 50 MG tablet Take 50 mg by mouth daily. 03/07/20   [provider]  sertraline (ZOLOFT) 50 MG tablet Take 1 tablet (50 mg total) by mouth at  bedtime. 05/20/20 05/20/21  Phineas Semen, MD    Allergies Penicillins  History reviewed. No pertinent family history.  Social History Social History   Tobacco Use  . Smoking status: Never Smoker  . Smokeless tobacco: Never Used  Vaping Use  . Vaping Use: Never used  Substance Use Topics  . Alcohol use: Not Currently  . Drug use: Not Currently    Review of Systems Constitutional: No fever/chills Eyes: No visual changes. Cardiovascular: Denies chest pain. Respiratory: Denies shortness of breath. Gastrointestinal: No abdominal pain.    Musculoskeletal: Negative for back pain. Skin: Negative for rash. Neurological: Negative for headaches, areas of focal weakness or numbness.    ____________________________________________   PHYSICAL EXAM:  VITAL SIGNS: ED Triage Vitals  Enc Vitals Group     BP 06/14/20 1709 (!) 141/95     Pulse Rate 06/14/20 1709 72     Resp 06/14/20 1709 20     Temp 06/14/20 1709 98.5 F (36.9 C)     Temp Source 06/14/20 1709 Oral     SpO2 06/14/20 1709 99 %     Weight 06/14/20 1709 190 lb (86.2 kg)     Height 06/14/20 1709 5\' 8"  (1.727 m)     Head Circumference --      Peak Flow --      Pain Score 06/14/20 1803 0     Pain Loc --  Pain Edu? --      Excl. in GC? --     Constitutional: Alert and oriented. Well appearing and in no acute distress.  Resting quite comfortably on his side without any discomfort or distress Eyes: Conjunctivae are normal. Head: Atraumatic. Nose: No congestion/rhinnorhea. Mouth/Throat: Mucous membranes are moist. Neck: No stridor.  Cardiovascular: Good peripheral circulation. Respiratory: Normal respiratory effort.  No retractions.  Gastrointestinal: No distention. Musculoskeletal: No lower extremity tenderness nor edema. Neurologic:  Normal speech and language. No gross focal neurologic deficits are appreciated.  Skin:  Skin is warm, dry and intact. No rash noted. Psychiatric: Mood and affect are  somewhat flat.  Denies any violent ideations, reports he does not want to hurt his sister is just upset with her at this point.  Denies that he wants to harm anyone.  Does not want to hurt himself.  ____________________________________________   LABS (all labs ordered are listed, but only abnormal results are displayed)  Labs Reviewed  SALICYLATE LEVEL - Abnormal; Notable for the following components:      Result Value   Salicylate Lvl <7.0 (*)    All other components within normal limits  ACETAMINOPHEN LEVEL - Abnormal; Notable for the following components:   Acetaminophen (Tylenol), Serum <10 (*)    All other components within normal limits  COMPREHENSIVE METABOLIC PANEL  ETHANOL  CBC  URINE DRUG SCREEN, QUALITATIVE ([facility] ONLY)   ____________________________________________  EKG   ____________________________________________  RADIOLOGY   ____________________________________________   PROCEDURES  Procedure(s) performed: None  Procedures  Critical Care performed: No  ____________________________________________   INITIAL IMPRESSION / ASSESSMENT AND PLAN / ED COURSE  Pertinent labs & imaging results that were available during my care of the patient were reviewed by me and considered in my medical decision making (see chart for details).   ----------------------------------------- 7:16 PM on 06/14/2020 -----------------------------------------  No acute medical complaints.  Patient is under IVC.  Cleared for psychiatric evaluation    ----------------------------------------- 9:43 PM on 06/14/2020 -----------------------------------------  Psychiatry team is rounded on the patient, they advised to maintain IVC and further disposition planning via their team and TTS.  ____________________________________________   FINAL CLINICAL IMPRESSION(S) / ED DIAGNOSES  Final diagnoses:  Aggressive behavior        Note:  This document was prepared using  Dragon voice recognition software and may include unintentional dictation errors       Sharyn Creamer, MD 06/14/20 2144

## 2020-06-14 NOTE — ED Notes (Signed)
Patient to restroom. Patient came back to bed. Patient calm and cooperative.

## 2020-06-14 NOTE — ED Notes (Signed)
IVC from [facility] 
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
## 2020-06-15 DIAGNOSIS — F918 Other conduct disorders: Secondary | ICD-10-CM | POA: Diagnosis not present

## 2020-06-15 NOTE — ED Notes (Addendum)
Pt asleep in bed, calm, NAD. Will continue to monitor

## 2020-06-15 NOTE — ED Notes (Signed)
Pt. Alert and oriented, warm and dry, in no distress. Pt. Denies SI, HI, and AVH. Pt. Encouraged to let nursing staff know of any concerns or needs. 

## 2020-06-15 NOTE — ED Notes (Signed)
Report to include Situation, Background, Assessment, and Recommendations received from RN. Patient alert and oriented, warm and dry, in no acute distress. Patient denies SI, HI, AVH and pain. Patient made aware of Q15 minute rounds and Rover and Officer presence for their safety. Patient instructed to come to me with needs or concerns.   

## 2020-06-15 NOTE — ED Notes (Signed)
Resumed care from Chad Young t rn.  Pt in hallway bed.  Pt alert, calm and cooperative.
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
## 2020-06-15 NOTE — ED Notes (Signed)
Patient has been appropriate and cooperative, NAD noted  

## 2020-06-15 NOTE — ED Provider Notes (Signed)
Emergency Medicine Observation Re-evaluation Note  Chad Young is a 49 y.o. male, seen on rounds today.  Pt initially presented to the ED for complaints of Homicidal Currently, the patient is asleep under blanket but awakes to name. He denies any HI at this time and is calm and cooperative.    Physical Exam  BP 124/72 (BP Location: Left Arm)   Pulse 69   Temp 98.9 F (37.2 C) (Oral)   Resp 17   Ht 5\' 8"  (1.727 m)   Wt 86.2 kg   SpO2 100%   BMI 28.89 kg/m  Physical Exam  Physical Exam General: No apparent distress HEENT: moist mucous membranes CV: RRR Pulm: Normal WOB GI: soft and non tender MSK: no edema or cyanosis Neuro: face symmetric, moving all extremities     ED Course / MDM  I have reviewed the labs performed to date as well as medications administered while in observation.  Recent changes in the last 24 hours include restarted home meds.  No PRNs for agitation.   Plan  Current plan is for psych eval  Patient is under full IVC at this time.   , MD 06/15/20 [phone]

## 2020-06-15 NOTE — BH Assessment (Addendum)
Writer spoke with pt.'s Godfather [phone] to inquire about pt's ability to return to his care upon D/C. Godfather reports that he is willing to accept the pt back into his home, however he needs 2 forms of identification in order to receive assistance from DSS to aid in supporting the pt. Godfather reported that he is no problem in the home, however he is only willing to accept the pt back if he has his 2 forms of identification. God father explained that the pt had been with him for over a month and his family members failed to provided the requested identification therefore he dropped the pt. Back off with pt.'s sister.

## 2020-06-15 NOTE — ED Notes (Signed)
Pt transferred to [facility], he is alert, oriented x4, clam and cooperative.

## 2020-06-15 NOTE — ED Notes (Signed)

## 2020-06-15 NOTE — ED Notes (Signed)
Pt resting comfortably in bed, calm and cooperative

## 2020-06-16 DIAGNOSIS — F918 Other conduct disorders: Secondary | ICD-10-CM | POA: Diagnosis not present
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
## 2020-06-16 NOTE — Final Progress Note (Signed)
Physician Final Progress Note  Patient ID: Chad Young MRN: [id number] DOB/AGE: 08/26/1971 49 y.o.  Admit date: 06/14/2020 Admitting provider: No admitting provider for patient encounter. Discharge date: 06/16/2020   Admission Diagnoses: Adjustment disorder Parent child iissues sibling discord IDD   ODD   Discharge Diagnoses:  Active Problems:   Intellectual disability   Adjustment disorder with mixed disturbance of emotions and conduct   Homicidal ideation   Aggression   Bipolar affective disorder, current episode mixed (HCC)   Anger   Mental and behavioral problem in adult    Mental Status brief Same as before Less angry More cooperative Oriented times three Not clouded or fluctuant Mood okay for now At baseline No active SI HI or plans      Consults:  TTS MD Psych/ ER MD   Significant Findings/ Diagnostic Studies: none   Procedures: none   Discharge Condition: {fair   Disposition:   IVC removed and he awaits SW placement   Diet:    As tolerated   Discharge Activity: as tolerated        Total time spent taking care of this patient: 30 minutes  Signed: Roselind Messier 06/16/2020, 3:35 PM

## 2020-06-16 NOTE — ED Notes (Signed)
VOL, pending SW placement 

## 2020-06-16 NOTE — ED Provider Notes (Signed)
Emergency Medicine Observation Re-evaluation Note  Chad Young is a 49 y.o. male, seen on rounds today.  Pt initially presented to the ED for complaints of Homicidal Currently, the patient is resting underneath blanket, response to voice and denies any complaints..  Physical Exam  BP 131/80 (BP Location: Left Arm)   Pulse (!) 58   Temp (!) 97.5 F (36.4 C) (Oral)   Resp 16   Ht 5\' 8"  (1.727 m)   Wt 86.2 kg   SpO2 97%   BMI 28.89 kg/m  Physical Exam Constitutional: Resting comfortably. Eyes: Conjunctivae are normal. Head: Atraumatic. Nose: No congestion/rhinnorhea. Mouth/Throat: Mucous membranes are moist. Neck: Normal ROM Cardiovascular: No cyanosis noted. Respiratory: Normal respiratory effort. Gastrointestinal: Non-distended. Genitourinary: deferred Musculoskeletal: No lower extremity tenderness nor edema. Neurologic:  Normal speech and language. No gross focal neurologic deficits are appreciated. Skin:  Skin is warm, dry and intact. No rash noted.    ED Course / MDM  EKG:    I have reviewed the labs performed to date as well as medications administered while in observation.  Recent changes in the last 24 hours include patient cleared by psychiatry, now pending social work assessment due to homelessness. Plan  Current plan is for social work assessment for additional resources due to homelessness.  Patient has been cleared by psychiatry but remains under IVC, I have asked psychiatry service to rescind. Patient is under full IVC at this time.   , MD 06/16/20 [phone]

## 2020-06-16 NOTE — ED Notes (Signed)
Hourly rounding reveals patient in room. No complaints, stable, in no acute distress. Q15 minute rounds and monitoring via Security Cameras to continue. 

## 2020-06-16 NOTE — NC FL2 (Signed)
°   MEDICAID FL2 LEVEL OF CARE SCREENING TOOL     IDENTIFICATION  Patient Name: Chad Young Birthdate: [birth year] Sex: male Admission Date (Current Location): 06/14/2020  [place] and [place] Number:  [address] [id number] [facility] Facility and Address:  [facility], [address] [place], [place] [address]      Provider Number: [id number]  Attending Physician Name and Address:  No att. providers found  Relative Name and Phone Number:  [name] [phone], sister or [name] [phone], godfather    Current Level of Care: Hospital Recommended Level of Care: Other (Comment), Family Care Home (Group Home) Prior Approval Number:    Date Approved/Denied:   PASRR Number:    Discharge Plan: Other (Comment) (Group Home/Family Care Home)    Current Diagnoses: Patient Active Problem List   Diagnosis Date Noted   Anger    Mental and behavioral problem in adult    Aggression 08/07/2019   Bipolar affective disorder, current episode mixed (HCC) 08/07/2019   Intellectual disability 11/10/2018   Adjustment disorder with mixed disturbance of emotions and conduct 11/10/2018   Homicidal ideation 11/10/2018    Orientation RESPIRATION BLADDER Height & Weight     Self, Time, Situation, Place  Normal Continent Weight: 190 lb (86.2 kg) Height:  5\' 8"  (172.7 cm)  BEHAVIORAL SYMPTOMS/MOOD NEUROLOGICAL BOWEL NUTRITION STATUS  Other (Comment) (Has verbally threatened sister in the past.)   Continent Diet  AMBULATORY STATUS COMMUNICATION OF NEEDS Skin     Verbally Normal                       Personal Care Assistance Level of Assistance  Bathing, Feeding, Dressing Bathing Assistance: Independent Feeding assistance: Independent Dressing Assistance: Independent     Functional Limitations Info             SPECIAL CARE FACTORS FREQUENCY                       Contractures Contractures Info: Not present    Additional Factors  Info                  Current Medications (06/16/2020):  This is the current hospital active medication list Current Facility-Administered Medications  Medication Dose Route Frequency Provider Last Rate Last Admin   ARIPiprazole (ABILIFY) tablet 5 mg  5 mg Oral QHS 08/17/2020, MD   5 mg at 06/15/20 2245   benztropine (COGENTIN) tablet 1 mg  1 mg Oral QHS 08/16/20, MD   1 mg at 06/15/20 2245   sertraline (ZOLOFT) tablet 50 mg  50 mg Oral QHS 08/16/20, MD   50 mg at 06/15/20 2245   Current Outpatient Medications  Medication Sig Dispense Refill   ARIPiprazole (ABILIFY) 5 MG tablet Take 1 tablet (5 mg total) by mouth at bedtime. 30 tablet 1   benztropine (COGENTIN) 1 MG tablet Take 1 tablet (1 mg total) by mouth at bedtime. 30 tablet 1   sertraline (ZOLOFT) 50 MG tablet Take 1 tablet (50 mg total) by mouth at bedtime. 30 tablet 1   sertraline (ZOLOFT) 50 MG tablet Take 50 mg by mouth daily.       Discharge Medications: Please see discharge summary for a list of discharge medications.  Relevant Imaging Results:  Relevant Lab Results:   Additional Information SS# [id number]  [id number], LCSWA

## 2020-06-16 NOTE — ED Notes (Signed)
Milk ,graham crackers and peanut butter given to patient

## 2020-06-16 NOTE — ED Notes (Signed)
Report to include Situation, Background, Assessment, and Recommendations received from RN. Patient alert and oriented, warm and dry, in no acute distress. Patient denies SI, HI, AVH and pain. Patient made aware of Q15 minute rounds and security cameras for their safety. Patient instructed to come to me with needs or concerns.  

## 2020-06-17 DIAGNOSIS — F918 Other conduct disorders: Secondary | ICD-10-CM | POA: Diagnosis not present

## 2020-06-17 NOTE — ED Notes (Signed)
Hourly rounding reveals patient in room. No complaints, stable, in no acute distress. Q15 minute rounds and monitoring via Security Cameras to continue. 

## 2020-06-17 NOTE — ED Notes (Signed)
VOL  PENDING  PLACEMENT 

## 2020-06-17 NOTE — ED Notes (Signed)
Assumed care of patient> patient sleeping at present time, as per shift report patient slept through out the night, up once to bathroom with no difficulties. Awaiting TTS/Psych plan of care. Safety maintained will monitor.

## 2020-06-17 NOTE — ED Provider Notes (Signed)
Emergency Medicine Observation Re-evaluation Note  Chad Young is a 49 y.o. male, seen on rounds today.  Pt initially presented to the ED for complaints of Homicidal Currently, the patient has no medical complaints.  Physical Exam  BP 108/72 (BP Location: Right Arm)   Pulse 68   Temp 98.2 F (36.8 C) (Oral)   Resp 18   Ht 5\' 8"  (1.727 m)   Wt 86.2 kg   SpO2 98%   BMI 28.89 kg/m  Physical Exam   General: No apparent distress HEENT: moist mucous membranes CV: RRR Pulm: Normal WOB GI: soft and non tender MSK: no edema or cyanosis Neuro: face symmetric, moving all extremities    ED Course / MDM  EKG:    I have reviewed the labs performed to date as well as medications administered while in observation. No new labs or changes in the clinical picture overnight. Plan  Current plan is for psychiatric disposition. Patient is not under full IVC at this time.   , Don Perking, MD 06/17/20 [phone]

## 2020-06-18 DIAGNOSIS — F918 Other conduct disorders: Secondary | ICD-10-CM | POA: Diagnosis not present

## 2020-06-18 NOTE — ED Notes (Signed)
Hourly rounding reveals patient sleeping in room. No complaints, stable, in no acute distress. Q15 minute rounds and monitoring via Security Cameras to continue. 

## 2020-06-18 NOTE — ED Provider Notes (Signed)
Emergency Medicine Observation Re-evaluation Note  Chad Young is a 49 y.o. male, seen on rounds today. Currently, the patient is sleeping and resting comfortably  Physical Exam  BP 104/66 (BP Location: Right Arm)   Pulse 68   Temp 98.4 F (36.9 C) (Oral)   Resp 18   Ht 5\' 8"  (1.727 m)   Wt 86.2 kg   SpO2 99%   BMI 28.89 kg/m  Physical Exam Patient sleeping comfortably in bed.   Constitutional comfortable resting easily Respiratory: Breathing well no retractions ED Course / MDM  EKG:    I have reviewed the labs performed to date as well as medications administered while in observation.  Recent changes in the last 24 hours include no reports of any agitation Plan  Current plan is for disposition per psychiatry and social work   , MD 06/18/20 [phone]

## 2020-06-18 NOTE — ED Notes (Signed)
Hourly rounding reveals patient awake in room. No complaints, stable, in no acute distress. Q15 minute rounds and monitoring via Security Cameras to continue. 

## 2020-06-18 NOTE — ED Notes (Signed)
Report to include Situation, Background, Assessment, and Recommendations received from Amy B. RN. Patient alert, warm and dry, in no acute distress. Patient denies SI, HI, AVH and pain. Patient made aware of Q15 minute rounds and security cameras for their safety. Patient instructed to come to me with needs or concerns. 

## 2020-06-19 DIAGNOSIS — F918 Other conduct disorders: Secondary | ICD-10-CM | POA: Diagnosis not present

## 2020-06-19 NOTE — ED Notes (Signed)
Hourly rounding reveals patient sleeping in room. No complaints, stable, in no acute distress. Q15 minute rounds and monitoring via Security Cameras to continue. 

## 2020-06-19 NOTE — ED Notes (Signed)
Pt refused snack at 2030 

## 2020-06-19 NOTE — ED Notes (Signed)
Hourly rounding reveals patient in rest room. No complaints, stable, in no acute distress. Q15 minute rounds and monitoring via Security Cameras to continue. 

## 2020-06-19 NOTE — ED Provider Notes (Signed)
Emergency Medicine Observation Re-evaluation Note  Chad Young is a 49 y.o. male, seen on rounds today.  Pt initially presented to the ED for complaints of Homicidal Currently, the patient is awaiting placement by social work.  Physical Exam  BP 113/75 (BP Location: Right Arm)   Pulse 70   Temp 97.9 F (36.6 C) (Oral)   Resp 16   Ht 5\' 8"  (1.727 m)   Wt 86.2 kg   SpO2 98%   BMI 28.89 kg/m  Physical Exam Patient sleeping comfortably in bed. Constitutional resting comfortably in no distress Respiratory: No respiratory distress no retractions CV ED Course / MDM  EKG:    I have reviewed the labs performed to date as well as medications administered while in observation. Plan  Patient awaiting placement by social work.   , MD 06/19/20 [phone]

## 2020-06-20 DIAGNOSIS — F918 Other conduct disorders: Secondary | ICD-10-CM | POA: Diagnosis not present

## 2020-06-20 NOTE — ED Notes (Signed)
Pt denies SI/HI/AVH on assessment 

## 2020-06-20 NOTE — ED Notes (Signed)
Pt received snack and vitals obtained.   lw edt

## 2020-06-20 NOTE — ED Notes (Signed)
VOL  PENDING  PLACEMENT 

## 2020-06-20 NOTE — ED Provider Notes (Signed)
Emergency Medicine Observation Re-evaluation Note  STRAN RAPER is a 49 y.o. male, seen on rounds today.  Pt initially presented to the ED for complaints of Homicidal Currently, the patient is calm, resting.  Physical Exam  BP 102/68 (BP Location: Left Arm)   Pulse 72   Temp 98.1 F (36.7 C) (Oral)   Resp 18   Ht 5\' 8"  (1.727 m)   Wt 86.2 kg   SpO2 100%   BMI 28.89 kg/m  Physical Exam   Gen: Calm, in NAD, resting in psych scrubs HEENT: NCAT CV: Well perfused Resp: Normal work of breathing, no apparent resp distress Neuro: Non-focal, no apparent deficits, at mental baseline Psych: Calm, resting  ED Course / MDM  EKG:    I have reviewed the labs performed to date as well as medications administered while in observation.  Recent changes in the last 24 hours include none, awaiting SW and Psych dispo. Plan  Current plan is for psych disposition. Patient is under full IVC at this time.   , MD 06/20/20 [phone]

## 2020-06-20 NOTE — ED Notes (Signed)
Meal tray given 

## 2020-06-21 DIAGNOSIS — F918 Other conduct disorders: Secondary | ICD-10-CM | POA: Diagnosis not present

## 2020-06-21 NOTE — ED Notes (Signed)
VOL  PENDING  PLACEMENT 

## 2020-06-21 NOTE — ED Notes (Signed)
Pt denies SI/HI/AVH on assessment 

## 2020-06-22 DIAGNOSIS — F918 Other conduct disorders: Secondary | ICD-10-CM | POA: Diagnosis not present

## 2020-06-22 NOTE — ED Notes (Signed)
Patient offered shower afer breakfast this morning. Patient refused and stated maybe later today.  °

## 2020-06-22 NOTE — ED Notes (Signed)
Patient observed with no unusual behavior or acute distress. Patient with no verbalized needs or c/o at this time.... will continue to monitor and follow up as needed. Security staff monitoring patient on camera system.  

## 2020-06-22 NOTE — ED Notes (Addendum)
Patient observed with no unusual behavior or acute distress. Patient with no verbalized needs or c/o at this time.... will continue to monitor and follow up as needed. Security staff monitoring patient on camera system. Q15 min done by EDT.

## 2020-06-22 NOTE — ED Notes (Signed)
Pt given meal tray and a cup of sprite.  

## 2020-06-22 NOTE — ED Notes (Signed)
Pt given snack tray at this time.  

## 2020-06-22 NOTE — ED Notes (Signed)
Report received from [facility]. Patient care assumed. Patient/RN introduction complete. Pt lying in bed at this time without complaints of pain or discomfort. Denies any SI or HI, pt pending placement.

## 2020-06-23 DIAGNOSIS — F918 Other conduct disorders: Secondary | ICD-10-CM | POA: Diagnosis not present

## 2020-06-23 NOTE — ED Notes (Signed)
Pt just received snack for the night and is resting in bed at this time with no complaints.

## 2020-06-23 NOTE — ED Notes (Signed)
Hourly rounding reveals patient in room. No complaints, stable, in no acute distress. Q15 minute rounds and monitoring via Security Cameras to continue. 

## 2020-06-23 NOTE — TOC Progression Note (Signed)
Transition of Care [facility]) - Progression Note    Patient Details  Name: Chad Young MRN: [id number] Date of Birth: 1971-09-27  Transition of [facility]) CM/SW Contact  [name] Phone Number: [phone]  06/23/2020, 2:03 PM  Clinical Narrative:     CSW still looking for group home placement for patient.  CSW called patient's sister Ms. White for update, she was unable to speak and stated she would call CSW later today.   Expected Discharge Plan: Group Home Barriers to Discharge: ED Facility/Family Refusing to Allow Patient to Return  Expected Discharge Plan and Services Expected Discharge Plan: Group Home In-house Referral: Clinical Social Work     Living arrangements for the past 2 months: Single Family Home                                       Social Determinants of Health (SDOH) Interventions    Readmission Risk Interventions No flowsheet data found.

## 2020-06-23 NOTE — ED Provider Notes (Signed)
Emergency Medicine Observation Re-evaluation Note  Chad Young is a 49 y.o. male, seen on rounds today.  Pt initially presented to the ED for complaints of Homicidal Currently, the patient is waiting in [facility] room.  Physical Exam  BP 105/71 (BP Location: Right Arm)   Pulse 79   Temp 97.9 F (36.6 C) (Oral)   Resp 16   Ht 5\' 8"  (1.727 m)   Wt 86.2 kg   SpO2 98%   BMI 28.89 kg/m  Physical Exam   Gen: Calm, in NAD, resting in psych scrubs HEENT: NCAT CV: Well perfused Resp: Normal work of breathing, no apparent resp distress Neuro: Non-focal, no apparent deficits, at mental baseline Psych: Calm, resting  ED Course / MDM  EKG:    I have reviewed the labs performed to date as well as medications administered while in observation.  Recent changes in the last 24 hours include none. Plan  Current plan is for psych disposition. Patient is under full IVC at this time.   , MD 06/23/20 [phone]

## 2020-06-23 NOTE — ED Notes (Signed)
Report to include Situation, Background, Assessment, and Recommendations received from RN. Patient alert and oriented, warm and dry, in no acute distress. Patient denies SI, HI, AVH and pain. Patient made aware of Q15 minute rounds and security cameras for their safety. Patient instructed to come to me with needs or concerns.  

## 2020-06-23 NOTE — ED Notes (Signed)
VOL  PENDING  PLACEMENT 

## 2020-06-23 NOTE — ED Notes (Signed)
Patient resting comfortably in room. No complaints or concerns voiced. No distress or abnormal behavior noted. Will continue to monitor with security cameras. Q 15 minute rounds continue. 

## 2020-06-23 NOTE — ED Notes (Addendum)
Patient up out of bed sitting in day room watching tv. Shower offered patient declined.

## 2020-06-24 DIAGNOSIS — F918 Other conduct disorders: Secondary | ICD-10-CM | POA: Diagnosis not present

## 2020-06-24 MED ORDER — HALOPERIDOL LACTATE 5 MG/ML IJ SOLN: 10.0000 mg | Freq: Once | INTRAMUSCULAR | Status: DC

## 2020-06-24 MED ORDER — LORAZEPAM 2 MG/ML IJ SOLN: 2.0000 mg | Freq: Once | INTRAMUSCULAR | Status: DC

## 2020-06-24 NOTE — ED Notes (Signed)
Hourly rounding reveals patient in day room. No complaints, stable, in no acute distress. Q15 minute rounds and monitoring via Security Cameras to continue. 

## 2020-06-24 NOTE — ED Notes (Signed)
Hourly rounding reveals patient in room. No complaints, stable, in no acute distress. Q15 minute rounds and monitoring via Security Cameras to continue. 

## 2020-06-24 NOTE — ED Notes (Signed)
Pt demanding his clothes, slamming doors and cursing at staff.  Pt is angry at his Child psychotherapist.  He states he has somewhere to go and wants to leave. EDP made aware.

## 2020-06-24 NOTE — ED Notes (Signed)
Pt has calmed down after speaking with RN and Engineer, materials. No medications given at this time.

## 2020-06-24 NOTE — ED Notes (Signed)
Pt allowed to use the phone. 

## 2020-06-24 NOTE — ED Notes (Signed)
Pt. Was given dinner tray and a drink. 

## 2020-06-24 NOTE — ED Notes (Signed)
Hourly rounding reveals patient asleep in room. No complaints, stable, in no acute distress. Q15 minute rounds and monitoring via Security Cameras to continue. 

## 2020-06-24 NOTE — ED Notes (Signed)
Report to include Situation, Background, Assessment, and Recommendations received from Amy B. RN. Patient alert and oriented, warm and dry, in no acute distress. Patient denies SI, HI, AVH and pain. Patient made aware of Q15 minute rounds and security cameras for their safety. Patient instructed to come to me with needs or concerns. 

## 2020-06-24 NOTE — ED Notes (Signed)
Hourly rounding reveals patient in hall. No complaints, stable, in no acute distress. Q15 minute rounds and monitoring via Security Cameras to continue. 

## 2020-06-25 DIAGNOSIS — F918 Other conduct disorders: Secondary | ICD-10-CM | POA: Diagnosis not present

## 2020-06-25 DIAGNOSIS — F3161 Bipolar disorder, current episode mixed, mild: Secondary | ICD-10-CM | POA: Diagnosis not present

## 2020-06-25 DIAGNOSIS — F4325 Adjustment disorder with mixed disturbance of emotions and conduct: Secondary | ICD-10-CM

## 2020-06-25 DIAGNOSIS — R4689 Other symptoms and signs involving appearance and behavior: Secondary | ICD-10-CM

## 2020-06-25 NOTE — ED Notes (Signed)
Hourly rounding reveals patient sleeping in room. No complaints, stable, in no acute distress. Q15 minute rounds and monitoring via Security Cameras to continue. 

## 2020-06-25 NOTE — ED Provider Notes (Signed)
Emergency Medicine Observation Re-evaluation Note  Chad Young is a 49 y.o. male, seen on rounds today.  Pt initially presented to the ED for complaints of homicidal.  No acute events overnight.  Continue to await psychiatric placement.  Physical Exam  BP 124/83 (BP Location: Right Arm)   Pulse 66   Temp 97.7 F (36.5 C) (Oral)   Resp 16   Ht 5\' 8"  (1.727 m)   Wt 86.2 kg   SpO2 99%   BMI 28.89 kg/m   ED Course / MDM    I have reviewed the labs performed to date as well as medications administered while in observation.  No new labs over the past 24 hours. Plan  Current plan is for awaiting placement.. Patient is not under full IVC at this time.   , MD 06/25/20 [phone]

## 2020-06-25 NOTE — ED Notes (Signed)
Hourly rounding reveals patient awake in room. No complaints, stable, in no acute distress. Q15 minute rounds and monitoring via Security Cameras to continue. 

## 2020-06-25 NOTE — ED Notes (Signed)
Hourly rounding reveals patient in day room. No complaints, stable, in no acute distress. Q15 minute rounds and monitoring via Security Cameras to continue. 

## 2020-06-25 NOTE — ED Notes (Addendum)
Hourly rounding reveals patient asleep in room. No complaints, stable, in no acute distress. Q15 minute rounds and monitoring via Security Cameras to continue. 

## 2020-06-25 NOTE — ED Notes (Signed)
Hourly rounding reveals patient in rest room. No complaints, stable, in no acute distress. Q15 minute rounds and monitoring via Security Cameras to continue. 

## 2020-06-25 NOTE — ED Notes (Addendum)
Pt to station asking to use the phone, pt explained that phone time starts at 0900.  Pt arguing with staff that he needs to use the phone now and that "your patient's come first."  Pt continues to argue with staff concerning rules and his stay in the department.  Pt told that rules have not changed and that phone hours start at 0900.  Pt continues to argue with staff and stating if he can't use the phone he wants his clothes to leave.  Pt instructed to return to his room and await breakfast and phone hours.  Pt continue to stand at door of nurses station and yell at staff.  Pt informed that the conversation is over and that staff will not discuss any of these issues further.

## 2020-06-25 NOTE — ED Notes (Signed)
Pt provided phone at this time. 

## 2020-06-25 NOTE — ED Notes (Signed)
Hourly rounding reveals patient asleep in room. No complaints, stable, in no acute distress. Q15 minute rounds and monitoring via Security Cameras to continue. 
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
## 2020-06-26 DIAGNOSIS — F4325 Adjustment disorder with mixed disturbance of emotions and conduct: Secondary | ICD-10-CM | POA: Diagnosis not present

## 2020-06-26 DIAGNOSIS — R4689 Other symptoms and signs involving appearance and behavior: Secondary | ICD-10-CM | POA: Diagnosis not present

## 2020-06-26 DIAGNOSIS — F3161 Bipolar disorder, current episode mixed, mild: Secondary | ICD-10-CM | POA: Diagnosis not present

## 2020-06-26 DIAGNOSIS — R454 Irritability and anger: Secondary | ICD-10-CM | POA: Diagnosis not present

## 2020-06-26 DIAGNOSIS — F79 Unspecified intellectual disabilities: Secondary | ICD-10-CM

## 2020-06-26 DIAGNOSIS — F918 Other conduct disorders: Secondary | ICD-10-CM | POA: Diagnosis not present

## 2020-06-26 NOTE — ED Provider Notes (Signed)
Emergency Medicine Observation Re-evaluation Note  Chad Young is a 49 y.o. male, seen on rounds today.  Pt initially presented to the ED for complaints of Homicidal Currently, the patient is resting in bed. Denies any SI  Physical Exam  BP 104/61 (BP Location: Right Arm)   Pulse 92   Temp 98.3 F (36.8 C) (Oral)   Resp 18   Ht 5\' 8"  (1.727 m)   Wt 86.2 kg   SpO2 98%   BMI 28.89 kg/m  Physical Exam Physical Exam General: No apparent distress HEENT: moist mucous membranes CV: RRR Pulm: Normal WOB GI: soft and non tender MSK: no edema or cyanosis Neuro: face symmetric, moving all extremities    ED Course / MDM  EKG:    I have reviewed the labs performed to date as well as medications administered while in observation.  Recent changes in the last 24 hours include no new labs. No meds for agitation.   Plan  Current plan is for placement  Patient is not under full IVC at this time.   , MD 06/26/20 [phone]

## 2020-06-26 NOTE — ED Notes (Signed)
Report to include Situation, Background, Assessment, and Recommendations received from Jadeka RN. Patient alert and oriented, warm and dry, in no acute distress. Patient denies SI, HI, AVH and pain. Patient made aware of Q15 minute rounds and security cameras for their safety. Patient instructed to come to me with needs or concerns. 

## 2020-06-26 NOTE — ED Notes (Signed)
Hourly rounding reveals patient awake in room. No complaints, stable, in no acute distress. Q15 minute rounds and monitoring via Security Cameras to continue. 

## 2020-06-26 NOTE — ED Notes (Signed)
Patient told officer "this is his last night, here at the hospital" patient told officer he was leaving tomorrow

## 2020-06-26 NOTE — ED Notes (Signed)
Hourly rounding reveals patient sleeping in room. No complaints, stable, in no acute distress. Q15 minute rounds and monitoring via Security Cameras to continue. 
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
## 2020-06-26 NOTE — ED Notes (Signed)
Lunch tray given. 

## 2020-06-27 DIAGNOSIS — F918 Other conduct disorders: Secondary | ICD-10-CM | POA: Diagnosis not present

## 2020-06-27 NOTE — ED Notes (Signed)
Hourly rounding reveals patient sleeping in room. No complaints, stable, in no acute distress. Q15 minute rounds and monitoring via Security Cameras to continue. 

## 2020-06-27 NOTE — ED Notes (Signed)
Juice was given to patient at this time

## 2020-06-27 NOTE — ED Notes (Signed)
Hourly rounding reveals patient in rest room. No complaints, stable, in no acute distress. Q15 minute rounds and monitoring via Security Cameras to continue. 

## 2020-06-27 NOTE — ED Notes (Signed)
VOL  PENDING  PLACEMENT 

## 2020-06-28 DIAGNOSIS — F918 Other conduct disorders: Secondary | ICD-10-CM | POA: Diagnosis not present

## 2020-06-28 NOTE — ED Notes (Signed)
Hand hygiene offered to patient and snack tray and soda given.  

## 2020-06-28 NOTE — ED Provider Notes (Signed)
Emergency Medicine Observation Re-evaluation Note  Chad Young is a 49 y.o. male, seen on rounds today.  Pt initially presented to the ED for complaints of Homicidal Currently, the patient is stable with no complaints this morning.  Physical Exam  BP 98/75   Pulse 66   Temp 97.8 F (36.6 C) (Oral)   Resp 17   Ht 5\' 8"  (1.727 m)   Wt 86.2 kg   SpO2 98%   BMI 28.89 kg/m  Physical Exam  General: No apparent distress HEENT: moist mucous membranes CV: RRR Pulm: Normal WOB GI: soft and non tender MSK: no edema or cyanosis Neuro: face symmetric, moving all extremities    ED Course / MDM  EKG:    I have reviewed the labs performed to date as well as medications administered while in observation.  No new labs or acute changes overnight. Plan  Current plan is for psychiatric placement. Patient is not under full IVC at this time.   , Don Perking, MD 06/28/20 [phone]

## 2020-06-28 NOTE — ED Notes (Signed)
Pt informed SW will attempt to come speak with him today, and if unable she will see him tomorrow. Per SW they are still trying to assist pt with placement.

## 2020-06-29 DIAGNOSIS — F918 Other conduct disorders: Secondary | ICD-10-CM | POA: Diagnosis not present

## 2020-06-29 NOTE — ED Notes (Signed)
Hourly rounding reveals patient in room. No complaints, stable, in no acute distress. Q15 minute rounds and monitoring via Security Cameras to continue. 

## 2020-06-29 NOTE — ED Provider Notes (Signed)
Emergency Medicine Observation Re-evaluation Note  Chad Young is a 49 y.o. male, seen on rounds today.  Pt initially presented to the ED for complaints of Homicidal  Patient had a good night, no acute events, reviewed nursing reports  Physical Exam  BP 112/75 (BP Location: Right Arm)   Pulse 71   Temp 97.7 F (36.5 C) (Oral)   Resp 18   Ht 1.727 m (5\' 8" )   Wt 86.2 kg   SpO2 99%   BMI 28.89 kg/m  Physical Exam Neurologically intact, calm, no aggressive incidents, affect is normal ED Course / MDM   Plan   Patient has been accepted to [facility] group home but unfortunately they not have a bed until Monday which is 5 days from now   Saturday, MD 06/29/20 [phone]

## 2020-06-29 NOTE — TOC Progression Note (Addendum)
Transition of Care [facility]) - Progression Note    Patient Details  Name: Chad Young MRN: [id number] Date of Birth: [birth year]  Transition of Care [facility]) CM/SW Contact  [name] Phone Number: [phone] 06/29/2020, 9:36 AM  Clinical Narrative:     CSW spoke with [name], [name], [phone], for possible placement.  [name] stated she will come by this morning to speak with CSW and possibly assess the patient.  11:39 - Patient has bed offer at [name].  [facility] will transport patient on Monday 07/04/2020.    Expected Discharge Plan: Group Home Barriers to Discharge: ED Facility/Family Refusing to Allow Patient to Return  Expected Discharge Plan and Services Expected Discharge Plan: Group Home In-house Referral: Clinical Social Work     Living arrangements for the past 2 months: Single Family Home                                       Social Determinants of Health (SDOH) Interventions    Readmission Risk Interventions No flowsheet data found.

## 2020-06-29 NOTE — ED Notes (Signed)
Pt denies SI/HI/AVH on assessment 

## 2020-06-29 NOTE — ED Notes (Signed)
Pt given meal tray and a cup of sprite.  

## 2020-06-29 NOTE — ED Notes (Signed)
Report to include Situation, Background, Assessment, and Recommendations received from RN. Patient alert and oriented, warm and dry, in no acute distress. Patient denies SI, HI, AVH and pain. Patient made aware of Q15 minute rounds and security cameras for their safety. Patient instructed to come to me with needs or concerns.  

## 2020-06-30 DIAGNOSIS — F918 Other conduct disorders: Secondary | ICD-10-CM | POA: Diagnosis not present

## 2020-06-30 NOTE — ED Notes (Signed)
Hourly rounding reveals patient in room. No complaints, stable, in no acute distress. Q15 minute rounds and monitoring via Security Cameras to continue. 

## 2020-06-30 NOTE — ED Notes (Addendum)
Hourly rounding reveals patient in room. No complaints, stable, in no acute distress. Q15 minute rounds and monitoring via Security Cameras to continue. 

## 2020-06-30 NOTE — ED Notes (Signed)
Report to include Situation, Background, Assessment, and Recommendations received from Amy RN. Patient alert and oriented, warm and dry, in no acute distress. Patient denies SI, HI, AVH and pain. Patient made aware of Q15 minute rounds and security cameras for their safety. Patient instructed to come to me with needs or concerns.  

## 2020-06-30 NOTE — ED Provider Notes (Signed)
Emergency Medicine Observation Re-evaluation Note  RITO LECOMTE is a 49 y.o. male, seen on rounds today.  Pt initially presented to the ED for complaints of Homicidal Currently, the patient is awake and sitting upright in bed, rocking gently which is usual behavior according to nursing staff monitoring him..  Nursing notes reviewed.  No acute events overnight.  Physical Exam  BP (!) 103/59    Pulse 72    Temp 98.2 F (36.8 C) (Oral)    Resp 15    Ht 5\' 8"  (1.727 m)    Wt 86.2 kg    SpO2 98%    BMI 28.89 kg/m  Physical Exam  Awake and alert, oriented.  Not in distress, breathing comfortably  ED Course / MDM  EKG:    I have reviewed the labs performed to date as well as medications administered while in observation.  Recent changes in the last 24 hours include none. Plan  Current plan is for discharge to home per his group home when bed available. Patient is not under full IVC at this time.   , MD 06/30/20 1029

## 2020-07-01 DIAGNOSIS — F918 Other conduct disorders: Secondary | ICD-10-CM | POA: Diagnosis not present

## 2020-07-01 NOTE — TOC Progression Note (Signed)
Transition of Care [facility]) - Progression Note    Patient Details  Name: Chad Young MRN: [id number] Date of Birth: 1970-12-25  Transition of Care [facility]) CM/SW Contact  [name] Phone Number: [phone] 07/01/2020, 11:16 AM  Clinical Narrative:     Planned placement to [name] on Monday 07/04/2020, CSW faxed Fl2 to [facility].  Mr. Venetia Constable will contact CSW with ETA on Monday.  Expected Discharge Plan: Group Home Barriers to Discharge: ED Facility/Family Refusing to Allow Patient to Return  Expected Discharge Plan and Services Expected Discharge Plan: Group Home In-house Referral: Clinical Social Work     Living arrangements for the past 2 months: Single Family Home                                       Social Determinants of Health (SDOH) Interventions    Readmission Risk Interventions No flowsheet data found.

## 2020-07-01 NOTE — ED Notes (Signed)
Hourly rounding reveals patient sleeping in room. No complaints, stable, in no acute distress. Q15 minute rounds and monitoring via Security Cameras to continue. 

## 2020-07-01 NOTE — ED Notes (Signed)
Hourly rounding reveals patient in room. No complaints, stable, in no acute distress. Q15 minute rounds and monitoring via Security Cameras to continue. 

## 2020-07-01 NOTE — ED Notes (Signed)
VOL  PENDING  PLACEMENT 

## 2020-07-01 NOTE — ED Notes (Signed)
Pt requested and provided with graham crackers and sprite

## 2020-07-02 DIAGNOSIS — F918 Other conduct disorders: Secondary | ICD-10-CM | POA: Diagnosis not present

## 2020-07-02 NOTE — ED Provider Notes (Signed)
Emergency Medicine Observation Re-evaluation Note  Chad Young is a 49 y.o. male, initially presented to the ED for complaints of homicidal ideation  Physical Exam  BP (!) 98/61 (BP Location: Left Arm)   Pulse 64   Temp 98.4 F (36.9 C) (Oral)   Resp 18   Ht 5\' 8"  (1.727 m)   Wt 86.2 kg   SpO2 96%   BMI 28.89 kg/m  No acute events over the past 24 hours.  ED Course / MDM  Patient's medical work-up has been largely nonrevealing. Plan  Current plan is for patient to be discharged to a group facility.  Currently awaiting facility acceptance Patient is not under full IVC at this time.   , MD 07/02/20 1422

## 2020-07-02 NOTE — ED Notes (Signed)
Hourly rounding reveals patient sleeping in room. No complaints, stable, in no acute distress. Q15 minute rounds and monitoring via Security Cameras to continue. 

## 2020-07-02 NOTE — ED Notes (Signed)
Hourly rounding reveals patient awake in room. No complaints, stable, in no acute distress. Q15 minute rounds and monitoring via Security Cameras to continue. 

## 2020-07-02 NOTE — ED Notes (Signed)
Pt given meal tray.

## 2020-07-02 NOTE — ED Notes (Signed)
Report to include Situation, Background, Assessment, and Recommendations received from Amy B. RN. Patient alert and oriented, warm and dry, in no acute distress. Patient denies SI, HI, AVH and pain. Patient made aware of Q15 minute rounds and security cameras for their safety. Patient instructed to come to me with needs or concerns. 

## 2020-07-03 DIAGNOSIS — F918 Other conduct disorders: Secondary | ICD-10-CM | POA: Diagnosis not present

## 2020-07-03 NOTE — ED Notes (Signed)
Pt ambulated to bathroom 

## 2020-07-03 NOTE — ED Provider Notes (Signed)
Emergency Medicine Observation Re-evaluation Note  Chad Young is a 49 y.o. male, seen on rounds today.  Pt initially presented to the ED for complaints of Homicidal Currently, the patient is resting comfortably  Physical Exam  BP 108/66 (BP Location: Right Arm)   Pulse 72   Temp 98.6 F (37 C) (Oral)   Resp 16   Ht 5\' 8"  (1.727 m)   Wt 86.2 kg   SpO2 99%   BMI 28.89 kg/m  Physical Exam Patient sleeping in bed comfortably.  Patient is in no distress. ED Course / MDM  EKG:    I have reviewed the labs performed to date as well as medications administered while in observation.  Plan  Patient is not under IVC at this time.  Patient is awaiting placement in a group home.   , MD 07/03/20 [phone]

## 2020-07-03 NOTE — ED Notes (Signed)
Meal tray given 

## 2020-07-03 NOTE — ED Notes (Signed)
Hourly rounding reveals patient sleeping in room. No complaints, stable, in no acute distress. Q15 minute rounds and monitoring via Security Cameras to continue. 

## 2020-07-04 ENCOUNTER — Emergency Department: Payer: Medicare Other

## 2020-07-04 DIAGNOSIS — F918 Other conduct disorders: Secondary | ICD-10-CM | POA: Diagnosis not present

## 2020-07-04 LAB — SARS CORONAVIRUS 2 BY RT PCR (HOSPITAL ORDER, PERFORMED IN ~~LOC~~ HOSPITAL LAB): SARS Coronavirus 2: NEGATIVE

## 2020-07-04 NOTE — ED Notes (Signed)
Patient had nasopharyngeal swab to be checked for coronavirus, Patient tolerated without difficulty per CNA. Nurse let Patient know that the group home that was coming to pick him up today had called and said they are having transportation issues, Nurse will keep him updated.

## 2020-07-04 NOTE — ED Notes (Signed)
Patient ask to use the phone, no signs of distress, will continue to monitor, surveillance camera in progress for safety, Patient denies Si/hi or avh.

## 2020-07-04 NOTE — ED Notes (Signed)
Patient talked with TTS, He is aware per TTS that He will be transported tomorrow to new group home, Patient has been pleasant, no signs of distress, no behavioral issues, will continue to monitor.

## 2020-07-04 NOTE — ED Provider Notes (Addendum)
Emergency Medicine Observation Re-evaluation Note  BROADUS COSTILLA is a 49 y.o. male, seen on rounds today.  Pt initially presented to the ED for complaints of Homicidal Currently, the patient is sitting in bed, no complaints.  No aggressive behavior noted.  Physical Exam  BP 108/71 (BP Location: Left Arm)   Pulse 60   Temp 97.8 F (36.6 C) (Oral)   Resp 18   Ht 1.727 m (5\' 8" )   Wt 86.2 kg   SpO2 99%   BMI 28.89 kg/m  Physical Exam Constitutional:      Appearance: Normal appearance.  Neurological:     General: No focal deficit present.     Mental Status: He is alert and oriented to person, place, and time. Mental status is at baseline.  Psychiatric:        Mood and Affect: Mood normal.        Behavior: Behavior normal.     ED Course / MDM   Plan  Current plan is to repeat Covid PCR for placement and to obtain chest x-ray to evaluate for TB for potential group home placement    , MD 07/04/20 1430    07/06/20, MD 07/04/20 1431

## 2020-07-04 NOTE — ED Notes (Signed)
Patient ambulated over to x-ray for Chest-x-ray, He was calm and cooperative, no behavioral issues, will continue to monitor.

## 2020-07-04 NOTE — TOC Progression Note (Signed)
Transition of Care [facility]) - Progression Note    Patient Details  Name: ALDRICH LLOYD MRN: [id number] Date of Birth: 1971-05-30  Transition of Care [facility]) CM/SW Contact  [name] Phone Number: [phone] 07/04/2020, 9:56 AM  Clinical Narrative:     CSw spoke with [name] at [facility], to update CSW they were having transportation issues. [name] stated the patient would need a TB test and COVID test for admittance to group home.  This CSW informed [name] that TB test results would not be ready this morning and [name] stated she would reschedule pick for tomorrow.  CSW updated EDP/ED/RN and patient's sister on the change in disposition.  CSW also updated the EDP/EDRN on the need for COVID and TB test.   Expected Discharge Plan: Group Home Barriers to Discharge: ED Facility/Family Refusing to Allow Patient to Return  Expected Discharge Plan and Services Expected Discharge Plan: Group Home In-house Referral: Clinical Social Work     Living arrangements for the past 2 months: Single Family Home                                       Social Determinants of Health (SDOH) Interventions    Readmission Risk Interventions No flowsheet data found.

## 2020-07-04 NOTE — BH Assessment (Signed)
Pt requested to speak to a SW regarding an update on placement. TTS was present and was provided pt's current disposition from his current RN. TTS provided pt the plan for him to be discharged to a [facility] tomorrow due to transportation issues today. TTS encouraged pt to direct any other specific questions to the current SW working with him. Pt was receptive and agreed to the plan.

## 2020-07-05 NOTE — ED Provider Notes (Signed)
Patient cleared by psychiatry service for discharge.  Plan is to discharge to group home.  Patient discharged stable condition.   Gilles Chiquito, MD 07/05/20 [phone]

## 2020-07-05 NOTE — Discharge Instructions (Addendum)
Your chest XR is Negative for any evidence of active TB and your COVID-19 test is also Negative

## 2020-07-05 NOTE — TOC Transition Note (Signed)
Transition of Care [facility]) - CM/SW Discharge Note   Patient Details  Name: Chad Young MRN: [id number] Date of Birth: 1971/09/01  Transition of Care [facility]) CM/SW Contact:  [name] Phone Number: [phone] 07/05/2020, 8:45 AM   Clinical Narrative:      Pt will discharge to [name] [phone], Mr. Venetia Constable will pick up the patient in the morning 7/27.  EDP/ED Staff Notified.  Patient's sister updated.  TOC will complete consult.   Barriers to Discharge: ED Facility/Family Refusing to Allow Patient to Return   Patient Goals and CMS Choice Patient states their goals for this hospitalization and ongoing recovery are:: To go back to his godfather's house.      Discharge Placement                       Discharge Plan and Services In-house Referral: Clinical Social Work                                   Social Determinants of Health (SDOH) Interventions     Readmission Risk Interventions No flowsheet data found.

## 2020-07-05 NOTE — ED Notes (Addendum)
Discharge teaching done with Ms. Chad Young, [facility] caregiver and she verbalized understanding. She signed the discharge paper form. Pt given all his personal belongings. Escorted to lobby, ambulatory and in NAD.

## 2020-07-11 DIAGNOSIS — Z79899 Other long term (current) drug therapy: Secondary | ICD-10-CM | POA: Diagnosis not present

## 2020-07-11 DIAGNOSIS — R454 Irritability and anger: Secondary | ICD-10-CM | POA: Diagnosis not present

## 2020-07-13 ENCOUNTER — Emergency Department
Admission: EM | Admit: 2020-07-13 | Discharge: 2020-07-20 | Disposition: A | Discharge status: Home / Self Care | Payer: Medicare Other | Attending: Emergency Medicine | Admitting: Emergency Medicine

## 2020-07-13 DIAGNOSIS — F4320 Adjustment disorder, unspecified: Secondary | ICD-10-CM | POA: Insufficient documentation

## 2020-07-13 DIAGNOSIS — F319 Bipolar disorder, unspecified: Secondary | ICD-10-CM | POA: Diagnosis present

## 2020-07-13 DIAGNOSIS — F3131 Bipolar disorder, current episode depressed, mild: Secondary | ICD-10-CM

## 2020-07-13 DIAGNOSIS — Z743 Need for continuous supervision: Secondary | ICD-10-CM | POA: Diagnosis not present

## 2020-07-13 DIAGNOSIS — I2510 Atherosclerotic heart disease of native coronary artery without angina pectoris: Secondary | ICD-10-CM | POA: Insufficient documentation

## 2020-07-13 DIAGNOSIS — R45850 Homicidal ideations: Secondary | ICD-10-CM | POA: Diagnosis not present

## 2020-07-13 DIAGNOSIS — Z20822 Contact with and (suspected) exposure to covid-19: Secondary | ICD-10-CM | POA: Diagnosis not present

## 2020-07-13 DIAGNOSIS — F3161 Bipolar disorder, current episode mixed, mild: Secondary | ICD-10-CM | POA: Insufficient documentation

## 2020-07-13 DIAGNOSIS — F4325 Adjustment disorder with mixed disturbance of emotions and conduct: Secondary | ICD-10-CM

## 2020-07-13 DIAGNOSIS — F79 Unspecified intellectual disabilities: Secondary | ICD-10-CM
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
## 2020-07-14 ENCOUNTER — Other Ambulatory Visit: Payer: Self-pay

## 2020-07-14 ENCOUNTER — Encounter: Payer: Self-pay | Admitting: *Deleted

## 2020-07-14 DIAGNOSIS — F3131 Bipolar disorder, current episode depressed, mild: Secondary | ICD-10-CM

## 2020-07-14 DIAGNOSIS — F319 Bipolar disorder, unspecified: Secondary | ICD-10-CM | POA: Diagnosis present

## 2020-07-14 DIAGNOSIS — F4325 Adjustment disorder with mixed disturbance of emotions and conduct: Secondary | ICD-10-CM

## 2020-07-14 LAB — COMPREHENSIVE METABOLIC PANEL
ALT: 11 U/L (ref 0–44)
AST: 18 U/L (ref 15–41)
Albumin: 4.2 g/dL (ref 3.5–5.0)
Alkaline Phosphatase: 70 U/L (ref 38–126)
Anion gap: 7 (ref 5–15)
BUN: 20 mg/dL (ref 6–20)
CO2: 29 mmol/L (ref 22–32)
Calcium: 9.2 mg/dL (ref 8.9–10.3)
Chloride: 102 mmol/L (ref 98–111)
Creatinine, Ser: 1.24 mg/dL (ref 0.61–1.24)
GFR calc Af Amer: >60 mL/min (ref >60)
GFR calc non Af Amer: >60 mL/min (ref >60)
Glucose, Bld: 101 mg/dL — ABNORMAL HIGH (ref 70–99)
Potassium: 3.9 mmol/L (ref 3.5–5.1)
Sodium: 138 mmol/L (ref 135–145)
Total Bilirubin: 0.7 mg/dL (ref 0.3–1.2)
Total Protein: 7.6 g/dL (ref 6.5–8.1)

## 2020-07-14 LAB — CBC
HCT: 44.3 % (ref 39.0–52.0)
Hemoglobin: 15 g/dL (ref 13.0–17.0)
MCH: 31.2 pg (ref 26.0–34.0)
MCHC: 33.9 g/dL (ref 30.0–36.0)
MCV: 92.1 fL (ref 80.0–100.0)
Platelets: 255 10*3/uL (ref 150–400)
RBC: 4.81 MIL/uL (ref 4.22–5.81)
RDW: 13 % (ref 11.5–15.5)
WBC: 6 10*3/uL (ref 4.0–10.5)
nRBC: 0 % (ref 0.0–0.2)

## 2020-07-14 LAB — URINE DRUG SCREEN, QUALITATIVE (ARMC ONLY)
Amphetamines, Ur Screen: NOT DETECTED
Barbiturates, Ur Screen: NOT DETECTED
Benzodiazepine, Ur Scrn: NOT DETECTED
Cannabinoid 50 Ng, Ur ~~LOC~~: NOT DETECTED
Cocaine Metabolite,Ur ~~LOC~~: NOT DETECTED
MDMA (Ecstasy)Ur Screen: NOT DETECTED
Methadone Scn, Ur: NOT DETECTED
Opiate, Ur Screen: NOT DETECTED
Phencyclidine (PCP) Ur S: NOT DETECTED
Tricyclic, Ur Screen: NOT DETECTED

## 2020-07-14 LAB — SARS CORONAVIRUS 2 BY RT PCR (HOSPITAL ORDER, PERFORMED IN ~~LOC~~ HOSPITAL LAB): SARS Coronavirus 2: NEGATIVE

## 2020-07-14 LAB — ETHANOL: Alcohol, Ethyl (B): <10 mg/dL (ref <10)

## 2020-07-14 MED ORDER — ARIPIPRAZOLE 5 MG PO TABS
5.0000 mg | ORAL_TABLET | Freq: Every day | ORAL | Status: DC
  Administered 2020-07-14 – 2020-07-19 (×3): 5 mg via ORAL
  Filled 2020-07-14 (×5): qty 1

## 2020-07-14 MED ORDER — BENZTROPINE MESYLATE 1 MG PO TABS
1.0000 mg | ORAL_TABLET | Freq: Every day | ORAL | Status: DC
  Administered 2020-07-14 – 2020-07-19 (×3): 1 mg via ORAL
  Filled 2020-07-14 (×3): qty 1

## 2020-07-14 MED ORDER — SERTRALINE HCL 50 MG PO TABS
50.0000 mg | ORAL_TABLET | Freq: Every day | ORAL | Status: DC
  Administered 2020-07-14 – 2020-07-19 (×6): 50 mg via ORAL
  Filled 2020-07-14 (×6): qty 1

## 2020-07-14 NOTE — ED Notes (Signed)
Patient went to restroom 

## 2020-07-14 NOTE — ED Notes (Signed)
Pt refusing to be discharged to group home. Charge rn, psych np, tts aware.

## 2020-07-14 NOTE — ED Notes (Signed)
Pt resting in bed with sheet over head. NAD at this time.

## 2020-07-14 NOTE — ED Notes (Signed)
Pt dressed out with Carollee Herter, RN and [place], [place]. Pt able to undress and dress self independently and was cooperative the entire time.   Pt has sneakers and two other garbage bags of belongings as well as a white shirt, button up shirt, jeans, belt, cell phone, black socks, black shoes and boxers.

## 2020-07-14 NOTE — BH Assessment (Signed)
Spoke with staff Elease Hashimoto [phone] about pt's discharge. Elease Hashimoto agreed to pick up the patient at around 6:45pm due to her transportation busy with other appointments.

## 2020-07-14 NOTE — ED Notes (Signed)
Pt given dinner tray.
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
## 2020-07-14 NOTE — ED Notes (Signed)
Pts group home, [facility], Elease Hashimoto [phone]

## 2020-07-14 NOTE — BH Assessment (Signed)
This writer attempted to contact [facility] [phone]; however there was no answer and the voicemail was full.

## 2020-07-14 NOTE — ED Notes (Signed)
Pt provided with meal tray and orange juice.Pt feeding self without difficulty. No further needs expressed at this time.

## 2020-07-14 NOTE — ED Provider Notes (Signed)
The patient has been evaluated at bedside by NP Shaune Pollack, psychiatry.  Patient is clinically stable.  Not felt to be a danger to self or others.  No SI or Hi.  No indication for inpatient psychiatric admission at this time.  Appropriate for continued outpatient therapy.    Willy Eddy, MD 07/14/20 [phone]

## 2020-07-14 NOTE — ED Provider Notes (Signed)
[facility] Emergency Department Provider Note  ____________________________________________   First MD Initiated Contact with Patient 07/13/20 2354     (approximate)  I have reviewed the triage vital signs and the nursing notes.   HISTORY  Chief Complaint Aggressive Behavior    HPI Chad Young is a 49 y.o. male with below list of previous medical conditions presents to the emergency department secondary to homicidal ideation directed to the owner of the group home where he resides.  Patient denies any suicidal ideation.  Patient denies any complaints       Past Medical History:  Diagnosis Date  . Borderline diabetes   . Coronary artery disease   . Excessive anger     Patient Active Problem List   Diagnosis Date Noted  . Anger   . Mental and behavioral problem in adult   . Aggression 08/07/2019  . Bipolar affective disorder, current episode mixed (HCC) 08/07/2019  . Intellectual disability 11/10/2018  . Adjustment disorder with mixed disturbance of emotions and conduct 11/10/2018  . Homicidal ideation 11/10/2018    History reviewed. No pertinent surgical history.  Prior to Admission medications   Medication Sig Start Date End Date Taking? Authorizing Provider  ARIPiprazole (ABILIFY) 5 MG tablet Take 1 tablet (5 mg total) by mouth at bedtime. 05/20/20   Phineas Semen, MD  benztropine (COGENTIN) 1 MG tablet Take 1 tablet (1 mg total) by mouth at bedtime. 05/20/20 05/20/21  Phineas Semen, MD  sertraline (ZOLOFT) 50 MG tablet Take 50 mg by mouth daily. 03/07/20   [provider]  sertraline (ZOLOFT) 50 MG tablet Take 1 tablet (50 mg total) by mouth at bedtime. 05/20/20 05/20/21  Phineas Semen, MD    Allergies Penicillins  History reviewed. No pertinent family history.  Social History Social History   Tobacco Use  . Smoking status: Never Smoker  . Smokeless tobacco: Never Used  Vaping Use  . Vaping Use: Never used    Substance Use Topics  . Alcohol use: Not Currently  . Drug use: Not Currently    Review of Systems Constitutional: No fever/chills Eyes: No visual changes. ENT: No sore throat. Cardiovascular: Denies chest pain. Respiratory: Denies shortness of breath. Gastrointestinal: No abdominal pain.  No nausea, no vomiting.  No diarrhea.  No constipation. Genitourinary: Negative for dysuria. Musculoskeletal: Negative for neck pain.  Negative for back pain. Integumentary: Negative for rash. Neurological: Negative for headaches, focal weakness or numbness. Psychiatric:  Positive for homicidal ideation.  Negative for suicidal ideation.  Negative for hallucinations  ____________________________________________   PHYSICAL EXAM:  VITAL SIGNS: ED Triage Vitals  Enc Vitals Group     BP 07/13/20 2353 130/79     Pulse Rate 07/13/20 2353 68     Resp 07/13/20 2353 16     Temp 07/13/20 2353 98.4 F (36.9 C)     Temp Source 07/13/20 2353 Oral     SpO2 07/13/20 2353 97 %     Weight 07/14/20 0000 86.2 kg (190 lb 0.6 oz)     Height 07/14/20 0000 1.727 m (5\' 8" )     Head Circumference --      Peak Flow --      Pain Score 07/14/20 0000 0     Pain Loc --      Pain Edu? --      Excl. in GC? --     Constitutional: Alert and oriented.  Eyes: Conjunctivae are normal.  Head: Atraumatic. Mouth/Throat: Patient is wearing a  mask. Neck: No stridor.  No meningeal signs.   Cardiovascular: Normal rate, regular rhythm. Good peripheral circulation. Grossly normal heart sounds. Respiratory: Normal respiratory effort.  No retractions. Gastrointestinal: Soft and nontender. No distention.  Musculoskeletal: No lower extremity tenderness nor edema. No gross deformities of extremities. Neurologic:  Normal speech and language. No gross focal neurologic deficits are appreciated.  Skin:  Skin is warm, dry and intact. Psychiatric: Mood and affect are normal. Speech and behavior are  normal.    Procedures   ____________________________________________   INITIAL IMPRESSION / MDM / ASSESSMENT AND PLAN / ED COURSE  As part of my medical decision making, I reviewed the following data within the electronic MEDICAL RECORD NUMBER   49 year old male presented with above-stated history and physical exam with homicidal ideation directed towards the owner of the group home where he resides.  Awaiting psychiatry consultation and disposition.  ____________________________________________  FINAL CLINICAL IMPRESSION(S) / ED DIAGNOSES  Final diagnoses:  Homicidal ideation     MEDICATIONS GIVEN DURING THIS VISIT:  Medications - No data to display   ED Discharge Orders    None      *Please note:  [name] was evaluated in Emergency Department on 07/14/2020 for the symptoms described in the history of present illness. He was evaluated in the context of the global COVID-19 pandemic, which necessitated consideration that the patient might be at risk for infection with the SARS-CoV-2 virus that causes COVID-19. Institutional protocols and algorithms that pertain to the evaluation of patients at risk for COVID-19 are in a state of rapid change based on information released by regulatory bodies including the CDC and federal and state organizations. These policies and algorithms were followed during the patient's care in the ED.  Some ED evaluations and interventions may be delayed as a result of limited staffing during and after the pandemic.*  Note:  This document was prepared using Dragon voice recognition software and may include unintentional dictation errors.   Darci Current, MD 07/14/20 [phone]

## 2020-07-14 NOTE — BH Assessment (Signed)
Spoke with group home staff at [facility] in [place]-[phone]. Staff agreed to pick pt up in the next 30 minutes.  °

## 2020-07-14 NOTE — Discharge Instructions (Addendum)
Please follow up with PCP and [facility].

## 2020-07-14 NOTE — Consult Note (Signed)
[facility] Psych ED Discharge  07/14/2020 4:37 PM Chad Young  MRN:  [id number] Principal Problem: Adjustment disorder with mixed disturbance of emotions and conduct Discharge Diagnoses: Principal Problem:   Adjustment disorder with mixed disturbance of emotions and conduct Active Problems:   Bipolar affective disorder (HCC)   Intellectual disability  Subjective: "I want a new group home."  Patient seen and evaluated in person by this provider.  He wants a new group home, explained we do not change group homes in the ED.  Denies suicidal/homicidal ideations, hallucinations, mania, substance abuse, or other psychiatric concerns.  He got upset at this group home over the television and wants to move out.  He does have IDD but is his own guardian.  Psychiatrically cleared at this time, social work consult placed as he refused to return to the group home. Dr Lucianne Muss reviewed the client and agrees he needs to return.  HPI per Modena Slater, BH Assessment: Chad Young is an 49 y.o. male presenting to [facility] ED voluntarily. Per triage note Pt to [facility] from A group home ([address] [place], [place]). Pt does not know the name of the group home. EMS reports pt was claiming he was going to burn down the group home and threatened staff as well as police that were on scene. Pt verified this and stated he was angry "at the lady" at the group home because she told him he could not have cable and when pt reported he wanted to change the address on his ID pt claims "the lady" at the group home stated he should have already done that. Pt reports this made him mad. Pt continues to verbalize he has a desire to hurt "the lady" at the group home. Pt is calm in ED and cooperative. During assessment patient is alert and oriented x4, calm and cooperative. Patient reported "I got into it with one of the group home owners, I asked if I could have the television, at first she said yes, then she said no and that made me  mad." Patient reports recently being placed at this group home "1 week ago." Patient reported previously living with family in [place], [place] before being placed. Patient reports being compliant with medication, reports getting good sleep and has a good appetite. Patient is currently denying SI/HI/AH/VH and does not appear to be responding to any internal or external stimuli.  Total Time spent with patient: 45 minutes  Past Psychiatric History: bipolar d/o, IDD  Past Medical History:  Past Medical History:  Diagnosis Date  . Borderline diabetes   . Coronary artery disease   . Excessive anger    History reviewed. No pertinent surgical history. Family History: History reviewed. No pertinent family history. Family Psychiatric  History: unknown Social History:  Social History   Substance and Sexual Activity  Alcohol Use Not Currently     Social History   Substance and Sexual Activity  Drug Use Not Currently    Social History   Socioeconomic History  . Marital status: Single    Spouse name: Not on file  . Number of children: Not on file  . Years of education: Not on file  . Highest education level: Not on file  Occupational History  . Not on file  Tobacco Use  . Smoking status: Never Smoker  . Smokeless tobacco: Never Used  Vaping Use  . Vaping Use: Never used  Substance and Sexual Activity  . Alcohol use: Not Currently  . Drug  use: Not Currently  . Sexual activity: Not on file  Other Topics Concern  . Not on file  Social History Narrative   ** Merged History Encounter **       Social Determinants of Health   Financial Resource Strain:   . Difficulty of Paying Living Expenses:   Food Insecurity:   . Worried About Programme researcher, broadcasting/film/video in the Last Year:   . Barista in the Last Year:   Transportation Needs:   . Freight forwarder (Medical):   Marland Kitchen Lack of Transportation (Non-Medical):   Physical Activity:   . Days of Exercise per Week:   . Minutes of  Exercise per Session:   Stress:   . Feeling of Stress :   Social Connections:   . Frequency of Communication with Friends and Family:   . Frequency of Social Gatherings with Friends and Family:   . Attends Religious Services:   . Active Member of Clubs or Organizations:   . Attends Banker Meetings:   Marland Kitchen Marital Status:     Has this patient used any form of tobacco in the last 30 days? (Cigarettes, Smokeless Tobacco, Cigars, and/or Pipes) A prescription for an FDA-approved tobacco cessation medication was offered at discharge and the patient refused  Current Medications: No current facility-administered medications for this encounter.   Current Outpatient Medications  Medication Sig Dispense Refill  . ARIPiprazole (ABILIFY) 5 MG tablet Take 1 tablet (5 mg total) by mouth at bedtime. 30 tablet 1  . benztropine (COGENTIN) 1 MG tablet Take 1 tablet (1 mg total) by mouth at bedtime. 30 tablet 1  . sertraline (ZOLOFT) 50 MG tablet Take 1 tablet (50 mg total) by mouth at bedtime. 30 tablet 1   PTA Medications: (Not in a hospital admission)   Musculoskeletal: Strength & Muscle Tone: within normal limits Gait & Station: normal Patient leans: N/A  Psychiatric Specialty Exam: Physical Exam Vitals and nursing note reviewed.  Constitutional:      Appearance: Normal appearance.  HENT:     Head: Normocephalic.  Pulmonary:     Effort: Pulmonary effort is normal.  Musculoskeletal:        General: Normal range of motion.     Cervical back: Normal range of motion.  Neurological:     General: No focal deficit present.     Mental Status: He is alert and oriented to person, place, and time.  Psychiatric:        Attention and Perception: Attention and perception normal.        Mood and Affect: Mood and affect normal.        Speech: Speech normal.        Behavior: Behavior normal. Behavior is cooperative.        Thought Content: Thought content normal.        Cognition and  Memory: Cognition is impaired.        Judgment: Judgment normal.     Review of Systems  All other systems reviewed and are negative.   Blood pressure 110/72, pulse (!) 58, temperature 98 F (36.7 C), temperature source Oral, resp. rate 16, height 5\' 8"  (1.727 m), weight 86.2 kg, SpO2 99 %.Body mass index is 28.89 kg/m.  General Appearance: Casual  Eye Contact:  Good  Speech:  Garbled  Volume:  Normal  Mood:  Euthymic  Affect:  Congruent  Thought Process:  Coherent and Descriptions of Associations: Intact  Orientation:  Full (Time, Place, and  Person)  Thought Content:  Logical  Suicidal Thoughts:  No  Homicidal Thoughts:  No  Memory:  Immediate;   Fair Recent;   Fair Remote;   Fair  Judgement:  Fair  Insight:  Fair  Psychomotor Activity:  Normal  Concentration:  Concentration: Fair and Attention Span: Fair  Recall:  [employer] of Knowledge:  Poor  Language:  Fair  Akathisia:  No  Handed:  Right  AIMS (if indicated):     Assets:  Housing Leisure Time Physical Health Resilience Social Support  ADL's:  Intact  Cognition:  Impaired,  Moderate  Sleep:        Demographic Factors:  Male  Loss Factors: NA  Historical Factors: NA  Risk Reduction Factors:   Sense of responsibility to family, Positive social support and Positive therapeutic relationship  Continued Clinical Symptoms:  None  Cognitive Features That Contribute To Risk:  None    Suicide Risk:  Minimal: No identifiable suicidal ideation.  Patients presenting with no risk factors but with morbid ruminations; may be classified as minimal risk based on the severity of the depressive symptoms   Follow-up Information    Schedule an appointment as soon as possible for a visit  with [facility]   Contact information: [address] [place] [address] [phone]               Plan Of Care/Follow-up recommendations:  Bipolar affective disorder: -Continued Abilify 5 mg  daily -continued Zoloft 50 mg daily -Return to group home  EPS: -Continued Cogentin 1 mg at bedtime Activity:  as tolerated Diet:  heart healthy diet  Disposition: discharge to group home Nanine Means, NP 07/14/2020, 4:37 PM

## 2020-07-15 NOTE — ED Notes (Signed)
Report to include Situation, Background, Assessment, and Recommendations received from Jadeka RN. Patient alert and oriented, warm and dry, in no acute distress. Patient denies SI, HI, AVH and pain. Patient made aware of Q15 minute rounds and Rover and Officer presence for their safety. Patient instructed to come to me with needs or concerns.  

## 2020-07-15 NOTE — ED Notes (Signed)
Attempted to call patients [name], to see if he would come and pick him up

## 2020-07-15 NOTE — ED Notes (Signed)
Patient sister called Misty Stanley and wanted an update on patients status, informed her that patient wants to go and live with [name] his Elisha Ponder, Misty Stanley said "that's not happening, [name] is not a good role model" if patient is still here tomorrow, she and father will be here when she gets off work at [employer]

## 2020-07-15 NOTE — ED Notes (Signed)
Spoke with [name] owner and administrator of patients group home, he called and wanted an update about patients discharge. Informed him that patient is discharged and that he can come and pick him up. Mr. Chad Young stated that he spoke with patients sister Misty Stanley and she told him that patient did not want to come back to the group home.

## 2020-07-15 NOTE — ED Notes (Signed)
Pt given lunch meal tray. 

## 2020-07-15 NOTE — ED Notes (Signed)
Writer asked patient what happened to his discharge, patient stated he likes being here

## 2020-07-15 NOTE — ED Notes (Signed)
Hourly rounding reveals patient awake in hall bed. No complaints, stable, in no acute distress. Q15 minute rounds and monitoring via Rover and Officer to continue.  

## 2020-07-15 NOTE — TOC Initial Note (Signed)
Transition of Care [facility]) - Initial/Assessment Note    Patient Details  Name: Chad Young MRN: [id number] Date of Birth: [birth year]  [facility] CM/SW Contact:    Kalaeloa Cellar, RN Phone Number: 07/15/2020, 5:55 PM  Clinical Narrative:                 Patient is well known to [facility] department and was placed in group home previous admission. Patient refusing to return to group home although group home is willing to take patient back. Sister is not willing to allow patient to come to her home and does not support idea of patient returning to [place], [place] although at this time [name] is not offering placement. ED RN states sister is planning to visit patient tomorrow and encourage group home placement.         Patient Goals and CMS Choice        Expected Discharge Plan and Services           Expected Discharge Date: 07/14/20                                    Prior Living Arrangements/Services                       Activities of Daily Living Home Assistive Devices/Equipment: None ADL Screening (condition at time of admission) Patient's cognitive ability adequate to safely complete daily activities?: Yes Is the patient deaf or have difficulty hearing?: No Does the patient have difficulty seeing, even when wearing glasses/contacts?: No Does the patient have difficulty concentrating, remembering, or making decisions?: No Patient able to express need for assistance with ADLs?: Yes Does the patient have difficulty dressing or bathing?: No Independently performs ADLs?: Yes (appropriate for developmental age) Does the patient have difficulty walking or climbing stairs?: No Weakness of Legs: None Weakness of Arms/Hands: None  Permission Sought/Granted                  Emotional Assessment              Admission diagnosis:  Psyc Eval Patient Active Problem List   Diagnosis Date Noted  . Bipolar affective disorder (HCC)  07/14/2020  . Anger   . Mental and behavioral problem in adult   . Aggression 08/07/2019  . Intellectual disability 11/10/2018  . Adjustment disorder with mixed disturbance of emotions and conduct 11/10/2018  . Homicidal ideation 11/10/2018   PCP:  Patient, No Pcp Per Pharmacy:   [facility] [address] [place] - [address] [place] [address] Phone: [phone] Fax: [phone]     Social Determinants of Health (SDOH) Interventions    Readmission Risk Interventions No flowsheet data found.

## 2020-07-15 NOTE — ED Notes (Signed)
Hourly rounding reveals patient asleep in hall bed. No complaints, stable, in no acute distress. Q15 minute rounds and monitoring via Rover and Officer to continue.  

## 2020-07-15 NOTE — ED Notes (Signed)
Hourly rounding reveals patient in hallway bed with eyes open. No complaints, stable, in no acute distress. Q15 minute rounds and monitoring via Rover and Officer to continue. °

## 2020-07-15 NOTE — ED Notes (Signed)
Patient just got off the phone with his sister Misty Stanley, he stated she said "she was done with me", patient is now asking to call his Godfather to come and pick him up.   I attempted to call sister Misty Stanley, no answer

## 2020-07-15 NOTE — ED Provider Notes (Signed)
Emergency Medicine Observation Re-evaluation Note  Chad Young is a 49 y.o. male, seen on rounds today.  Pt initially presented to the ED for complaints of Aggressive Behavior Currently, the patient is being prepared for discharge  Physical Exam  BP (!) 145/95   Pulse 66   Temp 97.8 F (36.6 C) (Oral)   Resp 16   Ht 5\' 8"  (1.727 m)   Wt 86.2 kg   SpO2 100%   BMI 28.89 kg/m  Physical Exam  Constitutional: Patient resting comfortably in bed Respiratory: Patient in no respiratory distress currently breathing comfortably Psychiatric: Patient is not agitated  ED Course / MDM  EKG:    I have reviewed the labs performed to date as well as medications administered while in observation. Plan  Patient plan for discharge today.   , MD 07/15/20 [phone]

## 2020-07-15 NOTE — ED Notes (Signed)
Spoke with Chad Young. patients sister and she stated that she spoke with patient and he stated that he would go back to the group home, and then changed his mind, patient sister Chad Young says she is frustrated and does not know what to do with patient, patient is his own legal guardian. Stated when she gets to work she will call and talk with patient

## 2020-07-15 NOTE — ED Notes (Signed)
Pt given breakfast tray

## 2020-07-15 NOTE — ED Notes (Signed)
Called Chad Young back and informed him that I spoke with sister, he stated he will not come back and get patient until patient is sure he wants to come back, he stated that because patient is his own legal guardian he is not forcing patient to stay with him, Chad Young stated he thought patient liked the group home, but then he asked someone to get him an I.d. made and when they told him they couldn't he got upset, and wanted to leave. [name] stated that he cant not take the chance that patient may try and jum out of the car, if he does not want to be here.

## 2020-07-16 NOTE — ED Notes (Signed)
Hourly rounding reveals patient asleep in room. No complaints, stable, in no acute distress. Q15 minute rounds and monitoring via Security Cameras to continue. 

## 2020-07-16 NOTE — ED Notes (Signed)
Report to include Situation, Background, Assessment, and Recommendations received from Amy B. RN. Patient alert and oriented, warm and dry, in no acute distress. Patient denies SI, HI, AVH and pain. Patient made aware of Q15 minute rounds and security cameras for their safety. Patient instructed to come to me with needs or concerns. 

## 2020-07-16 NOTE — ED Notes (Signed)
Hourly rounding reveals patient sleeping in room. No complaints, stable, in no acute distress. Q15 minute rounds and monitoring via Security Cameras to continue. 

## 2020-07-16 NOTE — ED Notes (Signed)
VS checked and shower has been offered. No other needs found a this moment. °

## 2020-07-16 NOTE — ED Notes (Signed)
Pt brought into ED BHU via sally port and wand with metal detector for safety by [employer] officer. Patient oriented to unit/care area: Pt informed of unit policies and procedures.  Informed that, for their safety, care areas are designed for safety and monitored by security cameras at all times. Patient verbalizes understanding, and verbal contract for safety obtained.Pt shown to their room.  

## 2020-07-16 NOTE — ED Notes (Signed)
Hourly rounding reveals patient sleeping in hall bed. No complaints, stable, in no acute distress. Q15 minute rounds and monitoring via Rover and Officer to continue.  

## 2020-07-16 NOTE — ED Notes (Signed)
lunch tray given. 

## 2020-07-16 NOTE — ED Notes (Signed)
Breakfast tray given. °

## 2020-07-16 NOTE — ED Provider Notes (Signed)
Emergency Medicine Observation Re-evaluation Note  VIMAL DEREGO is a 49 y.o. male, seen on rounds today.  Pt initially presented to the ED for complaints of Aggressive Behavior Currently, the patient is resting calmly.  Denies any acute complaints..  Physical Exam  BP 134/88 (BP Location: Right Arm)   Pulse 63   Temp 98 F (36.7 C) (Oral)   Resp 16   Ht 5\' 8"  (1.727 m)   Wt 86.2 kg   SpO2 99%   BMI 28.89 kg/m  Physical Exam Vitals and nursing note reviewed.  Constitutional:      General: He is not in acute distress. Cardiovascular:     Rate and Rhythm: Normal rate.  Pulmonary:     Effort: No respiratory distress.  Neurological:     Mental Status: He is alert.     ED Course / MDM  EKG:    I have reviewed the labs performed to date as well as medications administered while in observation.  Recent changes in the last 24 hours include none. Plan  Current plan is for awaiting transportation. Patient is not under full IVC at this time.   , MD 07/16/20 1001

## 2020-07-17 NOTE — ED Notes (Signed)
Hourly rounding reveals patient sleeping in room. No complaints, stable, in no acute distress. Q15 minute rounds and monitoring via Security Cameras to continue. 

## 2020-07-17 NOTE — ED Notes (Signed)
Hourly rounding reveals patient in room. No complaints, stable, in no acute distress. Q15 minute rounds and monitoring via Security Cameras to continue.  Dinner tray provided. 

## 2020-07-17 NOTE — ED Notes (Signed)
Hourly rounding reveals patient in room. No complaints, stable, in no acute distress. Q15 minute rounds and monitoring via Security Cameras to continue. 

## 2020-07-17 NOTE — ED Provider Notes (Signed)
Emergency Medicine Observation Re-evaluation Note  Chad Young is a 49 y.o. male, seen on rounds today.  Pt initially presented to the ED for complaints of Aggressive Behavior Currently, the patient is resting in bed.  He is awake and alert, no acute concerns.  Has no complaints at this time besides wishing to leave with his godfather..  Physical Exam  BP 115/78   Pulse 72   Temp 97.8 F (36.6 C) (Oral)   Resp 17   Ht 5\' 8"  (1.727 m)   Wt 86.2 kg   SpO2 99%   BMI 28.89 kg/m  Physical Exam  General: Patient awake alert, no distress. Cardiovascular: Regular rate and rhythm around 70 bpm. Respiratory: Equal breath sounds bilaterally, clear to auscultation. Abdominal: Nontender  ED Course / MDM  No new lab work, lab work reviewed by myself is largely nonrevealing.  Negative Covid. Plan  Current plan is for group home placement.. Patient is not under full IVC at this time.   , MD 07/17/20 [phone]

## 2020-07-18 NOTE — ED Provider Notes (Signed)
Emergency Medicine Observation Re-evaluation Note  Chad Young is a 49 y.o. male, seen on rounds today.  Pt initially presented to the ED for complaints of Aggressive Behavior   Physical Exam  BP 110/71   Pulse 70   Temp 97.8 F (36.6 C)   Resp 18   Ht 5\' 8"  (1.727 m)   Wt 86.2 kg   SpO2 100%   BMI 28.89 kg/m  Physical Exam  Constitutional: Patient resting comfortably in bed Respiratory: Patient in no respiratory distress breathing comfortably Psych: Patient is not agitated  ED Course / MDM  EKG:    I have reviewed the labs performed to date as well as medications administered while in observation.   Plan  Patient is awaiting placement   , MD 07/18/20 [phone]

## 2020-07-18 NOTE — ED Notes (Signed)
Patient ate 100% of supper and beverage.  

## 2020-07-18 NOTE — ED Notes (Signed)
Hourly rounding reveals patient awake in room. No complaints, stable, in no acute distress. Q15 minute rounds and monitoring via Security Cameras to continue. 

## 2020-07-18 NOTE — ED Notes (Signed)
Hourly rounding reveals patient awake in hall. No complaints, stable, in no acute distress. Q15 minute rounds and monitoring via [employer] to continue.

## 2020-07-18 NOTE — ED Notes (Signed)
Report to include Situation, Background, Assessment, and Recommendations received from Wendy RN. Patient alert and oriented, warm and dry, in no acute distress. Patient denies SI, HI, AVH and pain. Patient made aware of Q15 minute rounds and security cameras for their safety. Patient instructed to come to me with needs or concerns.  

## 2020-07-18 NOTE — ED Notes (Signed)
Hourly rounding reveals patient sleeping in room. No complaints, stable, in no acute distress. Q15 minute rounds and monitoring via Security Cameras to continue. 

## 2020-07-18 NOTE — ED Notes (Signed)
VOL  

## 2020-07-19 LAB — SARS CORONAVIRUS 2 BY RT PCR (HOSPITAL ORDER, PERFORMED IN ~~LOC~~ HOSPITAL LAB): SARS Coronavirus 2: NEGATIVE

## 2020-07-19 NOTE — ED Notes (Signed)
Hourly rounding reveals patient sleeping in room. No complaints, stable, in no acute distress. Q15 minute rounds and monitoring via Security Cameras to continue. 

## 2020-07-19 NOTE — ED Provider Notes (Signed)
Emergency Medicine Observation Re-evaluation Note  Chad Young is a 49 y.o. male, seen on rounds today.  Pt initially presented to the ED for complaints of Aggressive Behavior Currently, the patient is resting.  Physical Exam  BP 113/75   Pulse 78   Temp 97.7 F (36.5 C) (Oral)   Resp 18   Ht 5\' 8"  (1.727 m)   Wt 86.2 kg   SpO2 99%   BMI 28.89 kg/m  Physical Exam  Constitutional: Patient is awake resting comfortably in bed Respiratory: Patient is in no respiratory distress Psych: Patient is not agitated  ED Course / MDM  EKG:    I have reviewed the labs performed to date as well as medications administered while in observation.  Recent changes in the last 24 hours include none. Plan  Current plan is for psych. Patient is under full IVC at this time.   , MD 07/19/20 [phone]

## 2020-07-19 NOTE — ED Notes (Addendum)
Drink and snack provided   Pt verbalizes  "I am hungry - breakfast is so late" assessment completed  He denies pain  Medications scheduled for hs  Educated him this am of the importance of his taking his medication     per 07/15/20 note from social work  -  He is here voluntary - awaiting placement   Group home is willing for him to return - pt desires to not return to this particular group home   Pt states  "It is a bad placement"

## 2020-07-19 NOTE — TOC Progression Note (Signed)
Transition of Care [facility]) - Progression Note    Patient Details  Name: Chad Young MRN: [id number] Date of Birth: [birth year]  Transition of Care [facility]) CM/SW Contact  Mapleton Cellar, RN Phone Number: 07/19/2020, 2:20 PM  Clinical Narrative:    Transportation arranged for 10am on Wednesday morning to return to [facility].         Expected Discharge Plan and Services           Expected Discharge Date: 07/14/20                                     Social Determinants of Health (SDOH) Interventions    Readmission Risk Interventions No flowsheet data found.

## 2020-07-19 NOTE — ED Notes (Signed)

## 2020-07-19 NOTE — ED Notes (Signed)
Hourly rounding reveals patient sleeping in room. No complaints, stable, in no acute distress. Q15 minute rounds and monitoring via [employer] to continue.  

## 2020-07-19 NOTE — TOC Progression Note (Signed)
Transition of Care [facility]) - Progression Note    Patient Details  Name: Chad Young MRN: [id number] Date of Birth: 1971-06-21  Transition of Care [facility]) CM/SW Contact  Joseph Art, [place] Phone Number: 07/19/2020, 10:51 AM  Clinical Narrative:     CSW spoke with [name], patient's sister, and she stated the patient is going to return to [name].  This CSW spoke with [name] at [facility] group Home and confirmed the patient will be returning.  [name] stated they will pick the patient between 12-1PM.  CSW notified EDP/BHU RN, and is waiting confirmation on whether the pateint will need a new COVID or TB test.        Expected Discharge Plan and Services           Expected Discharge Date: 07/14/20                                     Social Determinants of Health (SDOH) Interventions    Readmission Risk Interventions No flowsheet data found.

## 2020-07-19 NOTE — ED Notes (Addendum)
Pt informed that he will be delayed another day - he expresses a dislike that he will have to stay another day and requests that social worker come to see him   Pt covid results requested by 1300 - resulted at 1305  Ordered by MD at 1105   Social work reports delay in discharge is because covid results were not ready before 1300   Social work Requested order through chat at [employer]   - sample obtained and walked to lab  - it has always been a 2 hour turn around on covid results since covid began

## 2020-07-19 NOTE — TOC Progression Note (Signed)
Transition of Care [facility]) - Progression Note    Patient Details  Name: Chad Young MRN: [id number] Date of Birth: 1971-07-21  Transition of Care [facility]) CM/SW Contact  [name] Phone Number: [phone] 07/19/2020, 10:20 AM  Clinical Narrative:     CSW spoke with patient's sister [name] [phone], for status update on patient disposition. [name] stated she spoke with the patient yesterday afternoon and made it clear to him that he could return to the [name] or this CSW would have to find a homeless shelter to house him. [name] stated the patient agreed to return to [name].    This CSW contacted Erica [phone], at [name] to confirm they would be able to take the patient.  [name], stated they has available placement for the patient but she needed confirmation from the patient that he was willing to return. [name] stated they would be able to pick up the patient between 12-1 PM today 07/19/2020.  This CSW spoke with the patient's sister [name] and requested she contact the patient to speak to him and confirm he was returning to the group home.  [name] stated she would contact him and call this CSW with his confirmation.        Expected Discharge Plan and Services           Expected Discharge Date: 07/14/20                                     Social Determinants of Health (SDOH) Interventions    Readmission Risk Interventions No flowsheet data found.

## 2020-07-20 NOTE — TOC Transition Note (Addendum)
Transition of Care [facility]) - CM/SW Discharge Note   Patient Details  Name: Chad Young MRN: [id number] Date of Birth: 05-04-1971  Transition of Care [facility]) CM/SW Contact:  [name] Phone Number: [phone] 07/20/2020, 8:30 AM   Clinical Narrative:     Patient will D/C to [facility].  [facility] will be here to pick up the patient at 10:00AM.  Erica [phone] is the contact.  EDP/RN and ED Secretary notified.  Patient will need AVS and COVID-test results. TOC consult complete.        Patient Goals and CMS Choice        Discharge Placement                       Discharge Plan and Services                                     Social Determinants of Health (SDOH) Interventions     Readmission Risk Interventions No flowsheet data found.

## 2020-07-20 NOTE — ED Provider Notes (Signed)
Emergency Medicine Observation Re-evaluation Note  Chad Young is a 49 y.o. male, seen on rounds today.  Pt initially presented to the ED for complaints of Aggressive Behavior Currently, the patient is resting, compliant with care requests.  Physical Exam  BP 107/65   Pulse 69   Temp 98.1 F (36.7 C) (Oral)   Resp 18   Ht 5\' 8"  (1.727 m)   Wt 86.2 kg   SpO2 99%   BMI 28.89 kg/m    ED Course / MDM  EKG:    I have reviewed the labs performed to date as well as medications administered while in observation.  Recent changes in the last 24 hours include plan for the patient to discharge back to group home today. Plan  Current plan is for discharge back to his group home. Patient is not under full IVC at this time.   , MD 07/20/20 [phone]

## 2020-07-20 NOTE — ED Provider Notes (Signed)
Patient's group home staff have arrived to pick patient up and take him back to his care home.   Sharyn Creamer, MD 07/20/20 [phone]

## 2020-07-25 DIAGNOSIS — R454 Irritability and anger: Secondary | ICD-10-CM | POA: Diagnosis not present

## 2020-07-25 DIAGNOSIS — Z79899 Other long term (current) drug therapy: Secondary | ICD-10-CM | POA: Diagnosis not present

## 2020-07-26 DIAGNOSIS — Z79899 Other long term (current) drug therapy: Secondary | ICD-10-CM | POA: Diagnosis not present

## 2020-08-10 DIAGNOSIS — R454 Irritability and anger: Secondary | ICD-10-CM | POA: Diagnosis not present

## 2020-08-10 DIAGNOSIS — Z79899 Other long term (current) drug therapy: Secondary | ICD-10-CM | POA: Diagnosis not present

## 2020-08-11 DIAGNOSIS — Z79899 Other long term (current) drug therapy: Secondary | ICD-10-CM | POA: Diagnosis not present

## 2020-08-24 DIAGNOSIS — Z79899 Other long term (current) drug therapy: Secondary | ICD-10-CM | POA: Diagnosis not present

## 2020-08-30 DIAGNOSIS — Z79899 Other long term (current) drug therapy: Secondary | ICD-10-CM | POA: Diagnosis not present

## 2020-09-12 DIAGNOSIS — R454 Irritability and anger: Secondary | ICD-10-CM | POA: Diagnosis not present

## 2020-09-12 DIAGNOSIS — Z79899 Other long term (current) drug therapy: Secondary | ICD-10-CM | POA: Diagnosis not present

## 2020-09-26 DIAGNOSIS — Z79899 Other long term (current) drug therapy: Secondary | ICD-10-CM | POA: Diagnosis not present

## 2020-10-10 DIAGNOSIS — Z79899 Other long term (current) drug therapy: Secondary | ICD-10-CM | POA: Diagnosis not present

## 2020-10-10 DIAGNOSIS — R454 Irritability and anger: Secondary | ICD-10-CM | POA: Diagnosis not present

## 2020-10-24 DIAGNOSIS — Z79899 Other long term (current) drug therapy: Secondary | ICD-10-CM | POA: Diagnosis not present

## 2020-10-31 DIAGNOSIS — H35033 Hypertensive retinopathy, bilateral: Secondary | ICD-10-CM | POA: Diagnosis not present

## 2020-11-01 DIAGNOSIS — Z79899 Other long term (current) drug therapy: Secondary | ICD-10-CM | POA: Diagnosis not present

## 2020-11-09 DIAGNOSIS — R454 Irritability and anger: Secondary | ICD-10-CM | POA: Diagnosis not present

## 2020-11-09 DIAGNOSIS — Z79899 Other long term (current) drug therapy: Secondary | ICD-10-CM | POA: Diagnosis not present

## 2020-11-17 DIAGNOSIS — Z79899 Other long term (current) drug therapy: Secondary | ICD-10-CM | POA: Diagnosis not present

## 2020-11-23 DIAGNOSIS — Z79899 Other long term (current) drug therapy: Secondary | ICD-10-CM | POA: Diagnosis not present

## 2020-12-02 DIAGNOSIS — Z79899 Other long term (current) drug therapy: Secondary | ICD-10-CM | POA: Diagnosis not present

## 2021-01-11 DIAGNOSIS — Z79899 Other long term (current) drug therapy: Secondary | ICD-10-CM | POA: Diagnosis not present

## 2021-01-11 DIAGNOSIS — R454 Irritability and anger: Secondary | ICD-10-CM | POA: Diagnosis not present

## 2021-01-13 DIAGNOSIS — Z79899 Other long term (current) drug therapy: Secondary | ICD-10-CM | POA: Diagnosis not present

## 2021-01-25 DIAGNOSIS — R454 Irritability and anger: Secondary | ICD-10-CM | POA: Diagnosis not present

## 2021-01-25 DIAGNOSIS — Z79899 Other long term (current) drug therapy: Secondary | ICD-10-CM | POA: Diagnosis not present

## 2021-01-26 DIAGNOSIS — Z79899 Other long term (current) drug therapy: Secondary | ICD-10-CM | POA: Diagnosis not present

## 2021-02-08 DIAGNOSIS — R454 Irritability and anger: Secondary | ICD-10-CM | POA: Diagnosis not present

## 2021-02-08 DIAGNOSIS — Z79899 Other long term (current) drug therapy: Secondary | ICD-10-CM | POA: Diagnosis not present

## 2021-02-22 DIAGNOSIS — E782 Mixed hyperlipidemia: Secondary | ICD-10-CM | POA: Diagnosis not present

## 2021-02-22 DIAGNOSIS — Z79899 Other long term (current) drug therapy: Secondary | ICD-10-CM | POA: Diagnosis not present

## 2021-02-22 DIAGNOSIS — E559 Vitamin D deficiency, unspecified: Secondary | ICD-10-CM | POA: Diagnosis not present

## 2021-02-27 DIAGNOSIS — Z79899 Other long term (current) drug therapy: Secondary | ICD-10-CM | POA: Diagnosis not present

## 2021-03-10 DIAGNOSIS — Z79899 Other long term (current) drug therapy: Secondary | ICD-10-CM | POA: Diagnosis not present

## 2021-03-10 DIAGNOSIS — R454 Irritability and anger: Secondary | ICD-10-CM | POA: Diagnosis not present

## 2021-03-29 DIAGNOSIS — E782 Mixed hyperlipidemia: Secondary | ICD-10-CM | POA: Diagnosis not present

## 2021-03-29 DIAGNOSIS — Z79899 Other long term (current) drug therapy: Secondary | ICD-10-CM | POA: Diagnosis not present

## 2021-03-29 DIAGNOSIS — E559 Vitamin D deficiency, unspecified: Secondary | ICD-10-CM | POA: Diagnosis not present

## 2021-03-31 DIAGNOSIS — Z79899 Other long term (current) drug therapy: Secondary | ICD-10-CM | POA: Diagnosis not present

## 2021-04-10 DIAGNOSIS — Z79899 Other long term (current) drug therapy: Secondary | ICD-10-CM | POA: Diagnosis not present

## 2021-04-10 DIAGNOSIS — R454 Irritability and anger: Secondary | ICD-10-CM | POA: Diagnosis not present

## 2021-04-11 DIAGNOSIS — Z79899 Other long term (current) drug therapy: Secondary | ICD-10-CM | POA: Diagnosis not present

## 2021-04-24 DIAGNOSIS — E559 Vitamin D deficiency, unspecified: Secondary | ICD-10-CM | POA: Diagnosis not present

## 2021-04-24 DIAGNOSIS — Z79899 Other long term (current) drug therapy: Secondary | ICD-10-CM | POA: Diagnosis not present

## 2021-04-24 DIAGNOSIS — E782 Mixed hyperlipidemia: Secondary | ICD-10-CM | POA: Diagnosis not present

## 2021-04-25 DIAGNOSIS — Z79899 Other long term (current) drug therapy: Secondary | ICD-10-CM | POA: Diagnosis not present

## 2021-04-30 DIAGNOSIS — H35033 Hypertensive retinopathy, bilateral: Secondary | ICD-10-CM | POA: Diagnosis not present

## 2021-05-05 DIAGNOSIS — Z79899 Other long term (current) drug therapy: Secondary | ICD-10-CM | POA: Diagnosis not present

## 2021-05-10 DIAGNOSIS — Z79899 Other long term (current) drug therapy: Secondary | ICD-10-CM | POA: Diagnosis not present

## 2021-05-10 DIAGNOSIS — R454 Irritability and anger: Secondary | ICD-10-CM | POA: Diagnosis not present

## 2021-05-24 DIAGNOSIS — E782 Mixed hyperlipidemia: Secondary | ICD-10-CM | POA: Diagnosis not present

## 2021-05-24 DIAGNOSIS — E559 Vitamin D deficiency, unspecified: Secondary | ICD-10-CM | POA: Diagnosis not present

## 2021-05-24 DIAGNOSIS — Z79899 Other long term (current) drug therapy: Secondary | ICD-10-CM | POA: Diagnosis not present

## 2021-06-09 DIAGNOSIS — Z79899 Other long term (current) drug therapy: Secondary | ICD-10-CM | POA: Diagnosis not present

## 2021-06-09 DIAGNOSIS — R454 Irritability and anger: Secondary | ICD-10-CM | POA: Diagnosis not present

## 2021-06-22 DIAGNOSIS — E782 Mixed hyperlipidemia: Secondary | ICD-10-CM | POA: Diagnosis not present

## 2021-06-22 DIAGNOSIS — E559 Vitamin D deficiency, unspecified: Secondary | ICD-10-CM | POA: Diagnosis not present

## 2021-06-22 DIAGNOSIS — Z79899 Other long term (current) drug therapy: Secondary | ICD-10-CM | POA: Diagnosis not present

## 2021-07-24 DIAGNOSIS — Z79899 Other long term (current) drug therapy: Secondary | ICD-10-CM | POA: Diagnosis not present

## 2021-07-24 DIAGNOSIS — M25512 Pain in left shoulder: Secondary | ICD-10-CM | POA: Diagnosis not present

## 2021-07-24 DIAGNOSIS — E782 Mixed hyperlipidemia: Secondary | ICD-10-CM | POA: Diagnosis not present

## 2021-07-24 DIAGNOSIS — E559 Vitamin D deficiency, unspecified: Secondary | ICD-10-CM | POA: Diagnosis not present

## 2021-08-10 DIAGNOSIS — Z79899 Other long term (current) drug therapy: Secondary | ICD-10-CM | POA: Diagnosis not present

## 2021-08-10 DIAGNOSIS — R454 Irritability and anger: Secondary | ICD-10-CM | POA: Diagnosis not present

## 2021-08-10 DIAGNOSIS — M25512 Pain in left shoulder: Secondary | ICD-10-CM | POA: Diagnosis not present

## 2021-08-24 DIAGNOSIS — Z79899 Other long term (current) drug therapy: Secondary | ICD-10-CM | POA: Diagnosis not present

## 2021-08-24 DIAGNOSIS — E782 Mixed hyperlipidemia: Secondary | ICD-10-CM | POA: Diagnosis not present

## 2021-08-24 DIAGNOSIS — M25512 Pain in left shoulder: Secondary | ICD-10-CM | POA: Diagnosis not present

## 2021-08-24 DIAGNOSIS — E559 Vitamin D deficiency, unspecified: Secondary | ICD-10-CM | POA: Diagnosis not present

## 2021-08-28 DIAGNOSIS — Z79899 Other long term (current) drug therapy: Secondary | ICD-10-CM | POA: Diagnosis not present

## 2021-09-09 DIAGNOSIS — Z79899 Other long term (current) drug therapy: Secondary | ICD-10-CM | POA: Diagnosis not present

## 2021-09-25 DIAGNOSIS — Z79899 Other long term (current) drug therapy: Secondary | ICD-10-CM | POA: Diagnosis not present

## 2021-09-25 DIAGNOSIS — E782 Mixed hyperlipidemia: Secondary | ICD-10-CM | POA: Diagnosis not present

## 2021-09-25 DIAGNOSIS — M25512 Pain in left shoulder: Secondary | ICD-10-CM | POA: Diagnosis not present

## 2021-09-25 DIAGNOSIS — E559 Vitamin D deficiency, unspecified: Secondary | ICD-10-CM | POA: Diagnosis not present

## 2021-10-02 DIAGNOSIS — M25512 Pain in left shoulder: Secondary | ICD-10-CM | POA: Diagnosis not present

## 2021-10-02 DIAGNOSIS — M7542 Impingement syndrome of left shoulder: Secondary | ICD-10-CM | POA: Diagnosis not present

## 2021-10-24 DIAGNOSIS — M7542 Impingement syndrome of left shoulder: Secondary | ICD-10-CM | POA: Diagnosis not present

## 2021-10-24 DIAGNOSIS — M25512 Pain in left shoulder: Secondary | ICD-10-CM | POA: Diagnosis not present

## 2021-10-25 DIAGNOSIS — E782 Mixed hyperlipidemia: Secondary | ICD-10-CM | POA: Diagnosis not present

## 2021-10-25 DIAGNOSIS — Z79899 Other long term (current) drug therapy: Secondary | ICD-10-CM | POA: Diagnosis not present

## 2021-10-25 DIAGNOSIS — M25512 Pain in left shoulder: Secondary | ICD-10-CM | POA: Diagnosis not present

## 2021-10-25 DIAGNOSIS — E559 Vitamin D deficiency, unspecified: Secondary | ICD-10-CM | POA: Diagnosis not present

## 2021-11-09 DIAGNOSIS — Z79899 Other long term (current) drug therapy: Secondary | ICD-10-CM | POA: Diagnosis not present

## 2021-11-09 DIAGNOSIS — R454 Irritability and anger: Secondary | ICD-10-CM | POA: Diagnosis not present

## 2021-11-22 DIAGNOSIS — M7542 Impingement syndrome of left shoulder: Secondary | ICD-10-CM | POA: Diagnosis not present

## 2021-11-22 DIAGNOSIS — M25512 Pain in left shoulder: Secondary | ICD-10-CM | POA: Diagnosis not present

## 2021-11-23 DIAGNOSIS — E782 Mixed hyperlipidemia: Secondary | ICD-10-CM | POA: Diagnosis not present

## 2021-11-23 DIAGNOSIS — E559 Vitamin D deficiency, unspecified: Secondary | ICD-10-CM | POA: Diagnosis not present

## 2021-11-23 DIAGNOSIS — Z79899 Other long term (current) drug therapy: Secondary | ICD-10-CM | POA: Diagnosis not present

## 2021-11-23 DIAGNOSIS — M25512 Pain in left shoulder: Secondary | ICD-10-CM | POA: Diagnosis not present

## 2021-11-24 DIAGNOSIS — M7542 Impingement syndrome of left shoulder: Secondary | ICD-10-CM | POA: Diagnosis not present

## 2021-11-24 DIAGNOSIS — M25512 Pain in left shoulder: Secondary | ICD-10-CM | POA: Diagnosis not present

## 2021-12-13 DIAGNOSIS — Z79899 Other long term (current) drug therapy: Secondary | ICD-10-CM | POA: Diagnosis not present

## 2021-12-13 DIAGNOSIS — R454 Irritability and anger: Secondary | ICD-10-CM | POA: Diagnosis not present

## 2021-12-27 DIAGNOSIS — E782 Mixed hyperlipidemia: Secondary | ICD-10-CM | POA: Diagnosis not present

## 2021-12-27 DIAGNOSIS — E559 Vitamin D deficiency, unspecified: Secondary | ICD-10-CM | POA: Diagnosis not present

## 2021-12-27 DIAGNOSIS — Z79899 Other long term (current) drug therapy: Secondary | ICD-10-CM | POA: Diagnosis not present

## 2021-12-27 DIAGNOSIS — M25512 Pain in left shoulder: Secondary | ICD-10-CM | POA: Diagnosis not present

## 2022-01-10 DIAGNOSIS — R454 Irritability and anger: Secondary | ICD-10-CM | POA: Diagnosis not present

## 2022-01-10 DIAGNOSIS — Z79899 Other long term (current) drug therapy: Secondary | ICD-10-CM | POA: Diagnosis not present

## 2022-01-12 DIAGNOSIS — Z79899 Other long term (current) drug therapy: Secondary | ICD-10-CM | POA: Diagnosis not present

## 2022-01-25 DIAGNOSIS — E559 Vitamin D deficiency, unspecified: Secondary | ICD-10-CM | POA: Diagnosis not present

## 2022-01-25 DIAGNOSIS — Z79899 Other long term (current) drug therapy: Secondary | ICD-10-CM | POA: Diagnosis not present

## 2022-01-25 DIAGNOSIS — E782 Mixed hyperlipidemia: Secondary | ICD-10-CM | POA: Diagnosis not present

## 2022-02-07 DIAGNOSIS — R454 Irritability and anger: Secondary | ICD-10-CM | POA: Diagnosis not present

## 2022-02-07 DIAGNOSIS — Z79899 Other long term (current) drug therapy: Secondary | ICD-10-CM | POA: Diagnosis not present

## 2022-02-14 ENCOUNTER — Ambulatory Visit (INDEPENDENT_AMBULATORY_CARE_PROVIDER_SITE_OTHER): Payer: Medicare Other | Admitting: Podiatry

## 2022-02-14 ENCOUNTER — Encounter: Payer: Self-pay | Admitting: Podiatry

## 2022-02-14 ENCOUNTER — Other Ambulatory Visit: Payer: Self-pay

## 2022-02-14 DIAGNOSIS — B353 Tinea pedis: Secondary | ICD-10-CM

## 2022-02-14 MED ORDER — KETOCONAZOLE 2 % EX CREA: 1.0000 "application " | TOPICAL_CREAM | Freq: Every day | CUTANEOUS | 2 refills | Status: DC

## 2022-02-14 NOTE — Progress Notes (Signed)
?  Subjective:  ?Patient ID: Chad Young, male    DOB: 11-24-1971,  MRN: [id number] ? ?Chief Complaint  ?Patient presents with  ? Nail Problem  ?  New Patient - Thick painful toenails  ? ? ?51 y.o. male presents with the above complaint. History confirmed with patient.  He lives in a group home.  His caretaker is not present with him.  He says his nails do not hurt him. ? ?Objective:  ?Physical Exam: ?warm, good capillary refill, no trophic changes or ulcerative lesions, normal DP and PT pulses, normal sensory exam, and nails normal in appearance no onychomycosis, there is some dry skin and a scaling moccasin distribution. ? ?Assessment:  ? ?1. Tinea pedis of both feet   ? ? ? ?Plan:  ?Patient was evaluated and treated and all questions answered. ? ?Discussed the etiology and treatment options for tinea pedis.  Discussed topical and oral treatment.  Recommended topical treatment with 2% ketoconazole cream.  This was sent to the patient's pharmacy.  Also discussed appropriate foot hygiene, use of antifungal spray such as Tinactin in shoes, as well as cleaning foot surfaces such as showers and bathroom floors with bleach. ? ? ?Return if symptoms worsen or fail to improve.  ? ?

## 2022-02-21 DIAGNOSIS — Z79899 Other long term (current) drug therapy: Secondary | ICD-10-CM | POA: Diagnosis not present

## 2022-02-21 DIAGNOSIS — E782 Mixed hyperlipidemia: Secondary | ICD-10-CM | POA: Diagnosis not present

## 2022-02-21 DIAGNOSIS — I10 Essential (primary) hypertension: Secondary | ICD-10-CM | POA: Diagnosis not present

## 2022-02-21 DIAGNOSIS — E559 Vitamin D deficiency, unspecified: Secondary | ICD-10-CM | POA: Diagnosis not present

## 2022-03-01 DIAGNOSIS — Z5181 Encounter for therapeutic drug level monitoring: Secondary | ICD-10-CM | POA: Diagnosis not present

## 2022-03-02 DIAGNOSIS — Z5181 Encounter for therapeutic drug level monitoring: Secondary | ICD-10-CM | POA: Diagnosis not present

## 2022-03-07 DIAGNOSIS — Z5181 Encounter for therapeutic drug level monitoring: Secondary | ICD-10-CM | POA: Diagnosis not present

## 2022-03-08 DIAGNOSIS — Z5181 Encounter for therapeutic drug level monitoring: Secondary | ICD-10-CM | POA: Diagnosis not present

## 2022-03-14 DIAGNOSIS — Z5181 Encounter for therapeutic drug level monitoring: Secondary | ICD-10-CM | POA: Diagnosis not present

## 2022-03-15 DIAGNOSIS — Z5181 Encounter for therapeutic drug level monitoring: Secondary | ICD-10-CM | POA: Diagnosis not present

## 2022-03-20 DIAGNOSIS — Z5181 Encounter for therapeutic drug level monitoring: Secondary | ICD-10-CM | POA: Diagnosis not present

## 2022-03-21 DIAGNOSIS — Z5181 Encounter for therapeutic drug level monitoring: Secondary | ICD-10-CM | POA: Diagnosis not present

## 2022-03-27 DIAGNOSIS — E559 Vitamin D deficiency, unspecified: Secondary | ICD-10-CM | POA: Diagnosis not present

## 2022-03-27 DIAGNOSIS — E782 Mixed hyperlipidemia: Secondary | ICD-10-CM | POA: Diagnosis not present

## 2022-03-27 DIAGNOSIS — M25512 Pain in left shoulder: Secondary | ICD-10-CM | POA: Diagnosis not present

## 2022-04-09 IMAGING — CR DG CHEST 1V
1 series · 1 of 1 positions shown · non-contrast
Comparison: None.

CLINICAL DATA: TB screening for facility placement.

EXAM:
CHEST  1 VIEW

[chest pa]
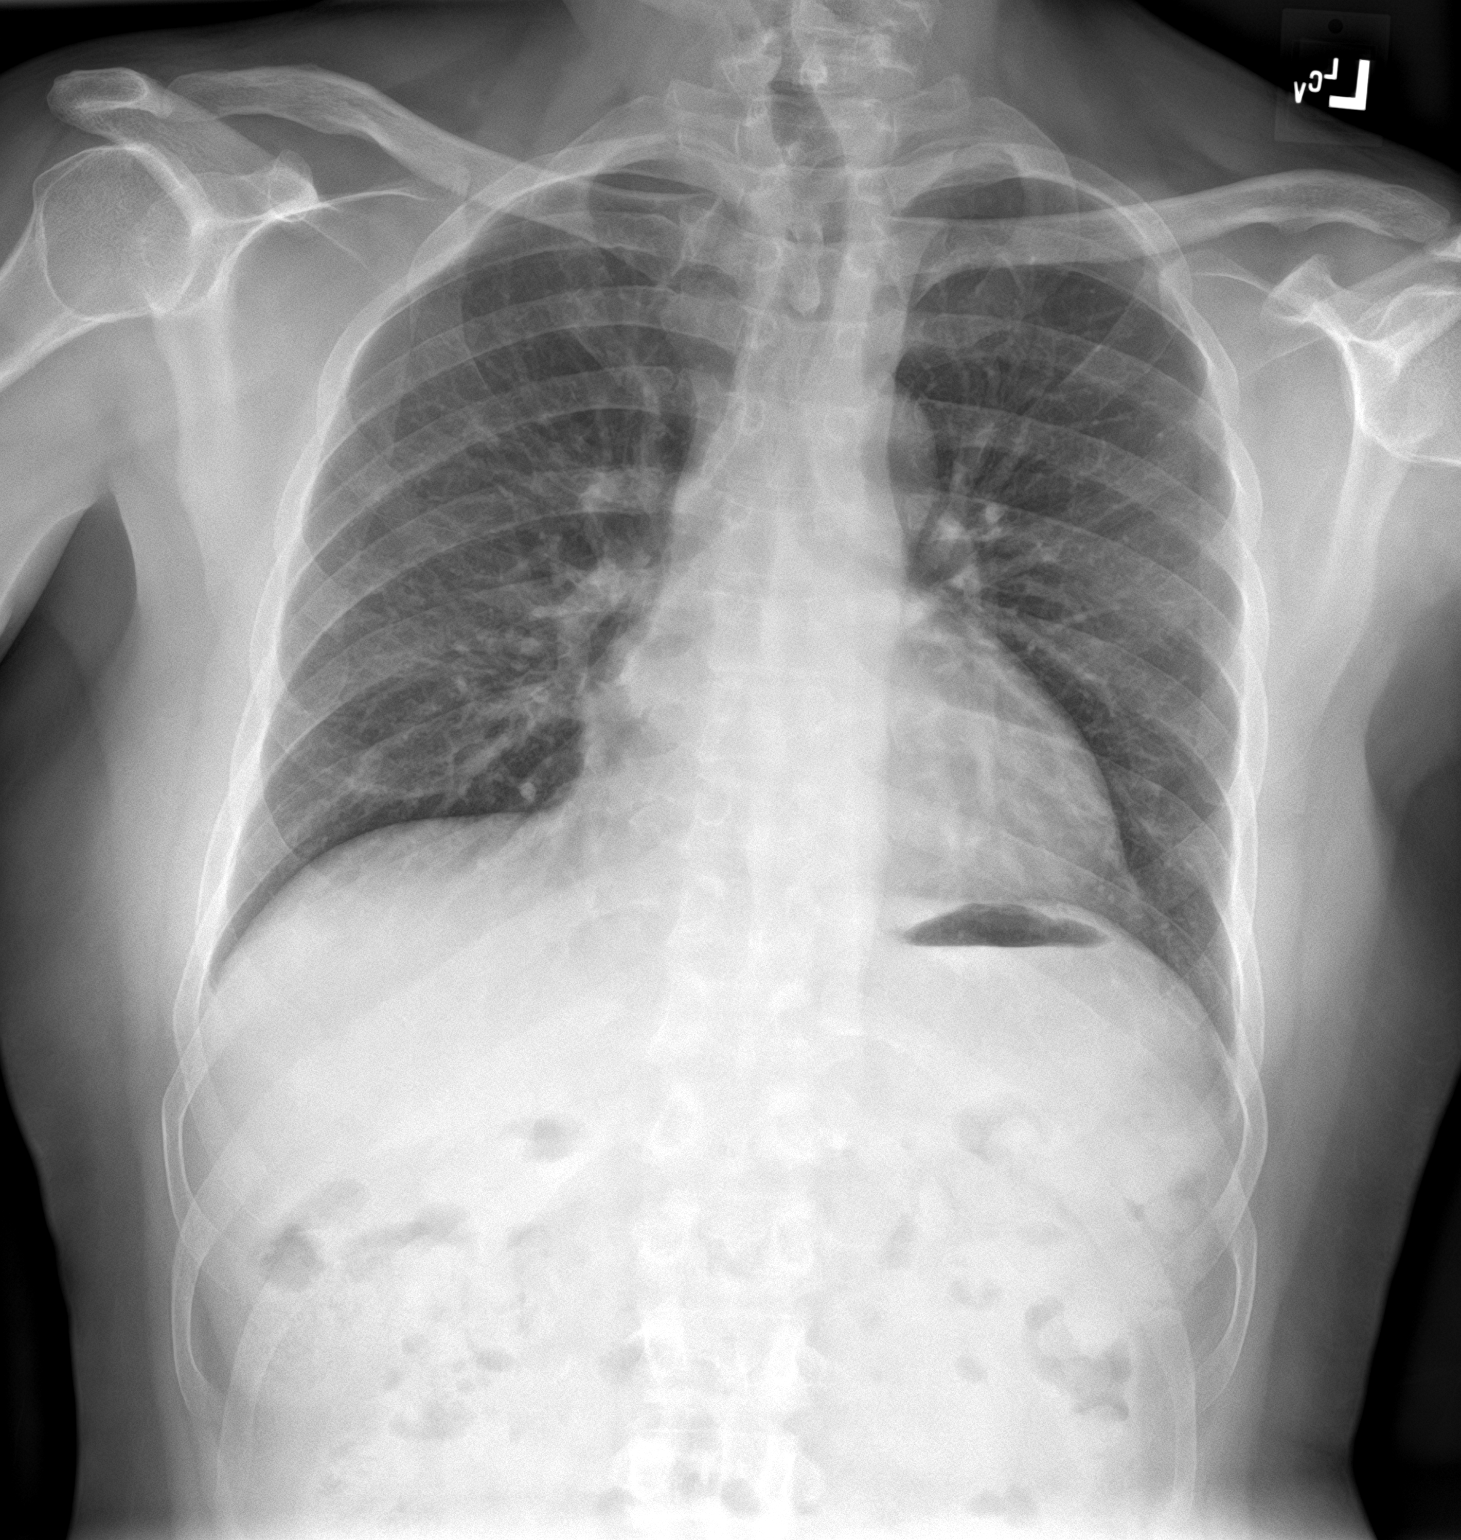

[1 of 1 positions shown; findings below may reference images not displayed]

FINDINGS: Trachea is midline. Heart is at the upper limits of normal in size.
Lungs are clear. No pleural fluid. Visualized upper abdomen is
unremarkable.
IMPRESSION: Negative.  No evidence of active tuberculosis

## 2022-04-18 DIAGNOSIS — H35033 Hypertensive retinopathy, bilateral: Secondary | ICD-10-CM | POA: Diagnosis not present

## 2022-10-29 DIAGNOSIS — H35033 Hypertensive retinopathy, bilateral: Secondary | ICD-10-CM | POA: Diagnosis not present

## 2022-11-13 DIAGNOSIS — E119 Type 2 diabetes mellitus without complications: Secondary | ICD-10-CM | POA: Diagnosis not present

## 2022-11-13 DIAGNOSIS — I10 Essential (primary) hypertension: Secondary | ICD-10-CM | POA: Diagnosis not present

## 2023-02-11 ENCOUNTER — Encounter: Payer: Self-pay | Admitting: Podiatry

## 2023-02-11 ENCOUNTER — Ambulatory Visit (INDEPENDENT_AMBULATORY_CARE_PROVIDER_SITE_OTHER): Payer: Medicare Other | Admitting: Podiatry

## 2023-02-11 DIAGNOSIS — L853 Xerosis cutis: Secondary | ICD-10-CM | POA: Diagnosis not present

## 2023-02-11 NOTE — Patient Instructions (Signed)
Moisturize feet once daily; do not apply between toes: A.  Aquaphor Healing Ointment B.  Vaseline Intensive Care Lotion C.  Lubriderm Lotion D.  Gold Bond Diabetic Foot Lotion E.  Eucerin Intensive Repair Moisturizing Lotion  If you have problems reaching your feet:  A.  Aquaphor Advanced Therapy Ointment Body Spray B.  Vaseline Intensive Care Spray Lotion Advanced Repair     

## 2023-02-11 NOTE — Progress Notes (Signed)
  Subjective:  Patient ID: Chad Young, male    DOB: 02-Dec-1971,  MRN: [id number]  Chief Complaint  Patient presents with   Nail Problem    "Toenails"    52 y.o. male presents with the above complaint. History confirmed with patient.  He is and says his toenails do not hurt.  Says he has dry skin  Objective:  Physical Exam: warm, good capillary refill, no trophic changes or ulcerative lesions, normal DP and PT pulses, normal sensory exam, and normal-appearing toenails without onychomycosis, some dry skin noted  Assessment:   1. Xerosis cutis      Plan:  Patient was evaluated and treated and all questions answered.  We discussed xerosis cutis and use of moisturizing creams and lotions.  Return as needed if this does not improve  Return if symptoms worsen or fail to improve.

## 2023-05-02 DIAGNOSIS — H35033 Hypertensive retinopathy, bilateral: Secondary | ICD-10-CM | POA: Diagnosis not present

## 2023-05-20 ENCOUNTER — Ambulatory Visit: Payer: Medicare Other | Admitting: Podiatry

## 2023-06-11 DIAGNOSIS — Z5181 Encounter for therapeutic drug level monitoring: Secondary | ICD-10-CM | POA: Diagnosis not present

## 2023-06-25 DIAGNOSIS — Z5181 Encounter for therapeutic drug level monitoring: Secondary | ICD-10-CM | POA: Diagnosis not present

## 2023-07-03 DIAGNOSIS — Z5181 Encounter for therapeutic drug level monitoring: Secondary | ICD-10-CM | POA: Diagnosis not present

## 2023-07-08 ENCOUNTER — Ambulatory Visit: Payer: Medicare Other | Admitting: Podiatry

## 2023-07-15 ENCOUNTER — Ambulatory Visit (INDEPENDENT_AMBULATORY_CARE_PROVIDER_SITE_OTHER): Payer: Medicare Other | Admitting: Podiatry

## 2023-07-15 ENCOUNTER — Encounter: Payer: Self-pay | Admitting: Podiatry

## 2023-07-15 DIAGNOSIS — B353 Tinea pedis: Secondary | ICD-10-CM | POA: Diagnosis not present

## 2023-07-15 MED ORDER — KETOCONAZOLE 2 % EX CREA: 1.0000 | TOPICAL_CREAM | Freq: Every day | CUTANEOUS | 2 refills | Status: AC

## 2023-07-15 NOTE — Patient Instructions (Signed)
 To prevent reinfection, spray shoes with lysol every evening.  Clean tub or shower with bleach based cleanser.  Athlete's Foot Athlete's foot (tinea pedis) is a fungal infection of the skin on your feet. It often occurs on the skin that is between or underneath the toes. It can also occur on the soles of your feet. The infection can spread from person to person (is contagious). It can also spread when a person's bare feet come in contact with the fungus on shower floors or on items such as shoes. What are the causes? This condition is caused by a fungus that grows in warm, moist places. You can get athlete's foot by sharing shoes, shower stalls, towels, and wet floors with someone who is infected. Not washing your feet or changing your socks often enough can also lead to athlete's foot. What increases the risk? This condition is more likely to develop in: Men. People who have a weak body defense system (immune system). People who have diabetes. People who use public showers, such as at a gym. People who wear heavy-duty shoes, such as industrial or military shoes. Seasons with warm, humid weather. What are the signs or symptoms? Symptoms of this condition include: Itchy areas between your toes or on the soles of your feet. White, flaky, or scaly areas between your toes or on the soles of your feet. Very itchy small blisters between your toes or on the soles of your feet. Small cuts in your skin. These cuts can become infected. Thick or discolored toenails. How is this diagnosed? This condition may be diagnosed with a physical exam and a review of your medical history. Your health care provider may also take a skin or toenail sample to examine under a microscope. How is this treated? This condition is treated with antifungal medicines. These may be applied as powders, ointments, or creams. In severe cases, an oral antifungal medicine may be given. Follow these instructions at  home: Medicines Apply or take over-the-counter and prescription medicines only as told by your health care provider. Apply your antifungal medicine as told by your health care provider. Do not stop using the antifungal even if your condition improves. Foot care Do not scratch your feet. Keep your feet dry: Wear cotton or wool socks. Change your socks every day or if they become wet. Wear shoes that allow air to flow, such as sandals or canvas tennis shoes. Wash and dry your feet, including the area between your toes. Also, wash and dry your feet: Every day or as told by your health care provider. After exercising. General instructions Do not let others use towels, shoes, nail clippers, or other personal items that touch your feet. Protect your feet by wearing sandals in wet areas, such as locker rooms and shared showers. Keep all follow-up visits. This is important. If you have diabetes, keep your blood sugar under control. Contact a health care provider if: You have a fever. You have swelling, soreness, warmth, or redness in your foot. Your feet are not getting better with treatment. Your symptoms get worse. You have new symptoms. You have severe pain. Summary Athlete's foot (tinea pedis) is a fungal infection of the skin on your feet. It often occurs on skin that is between or underneath the toes. This condition is caused by a fungus that grows in warm, moist places. Symptoms include white, flaky, or scaly areas between your toes or on the soles of your feet. This condition is treated with antifungal medicines.   Keep your feet clean. Always dry them thoroughly. This information is not intended to replace advice given to you by your health care provider. Make sure you discuss any questions you have with your health care provider. Document Revised: 03/19/2021 Document Reviewed: 03/19/2021 Elsevier Patient Education  2024 Elsevier Inc.  

## 2023-07-24 NOTE — Progress Notes (Signed)
  Subjective:  Patient ID: Chad Young, male    DOB: October 09, 1971,  MRN: [id number]  Chad Young presents to clinic today for: follow up preventative foot care.    PCP is Patient, No Pcp Per.  Allergies  Allergen Reactions   Penicillins     Review of Systems: Negative except as noted in the HPI.  Objective: No changes noted in today's physical examination. There were no vitals filed for this visit.  Chad Young is a pleasant 52 y.o. male in NAD. AAO x 3.  Vascular Examination: Capillary refill time <3 seconds b/l LE. Palpable pedal pulses b/l LE. Digital hair present b/l. No pedal edema b/l. Skin temperature gradient WNL b/l. No varicosities b/l. No cyanosis or clubbing noted b/l LE.Marland Kitchen  Dermatological Examination: Pedal skin with normal turgor, texture and tone b/l. No open wounds. No interdigital macerations b/l. Toenails recently debrided. No hyperkeratotic nor porokeratotic lesions present on today's visit. Diffuse scaling noted peripherally and plantarly b/l feet.  No interdigital macerations.  No blisters, no weeping. No signs of secondary bacterial infection noted..  Neurological Examination: Protective sensation intact with 10 gram monofilament b/l LE. Vibratory sensation intact b/l LE.   Musculoskeletal Examination: Normal muscle strength 5/5 to all lower extremity muscle groups bilaterally. No pain, crepitus or joint limitation noted with ROM b/l LE. No gross bony pedal deformities b/l. Patient ambulates independently without assistive aids.  Assessment/Plan: 1. Tinea pedis of both feet     Meds ordered this encounter  Medications   ketoconazole (NIZORAL) 2 % cream    Sig: Apply 1 Application topically daily.    Dispense:  60 g    Refill:  2   -Consent given for treatment as described below: -Examined patient. -Patient to continue soft, supportive shoe gear daily. -As a courtesy, toenails filed with dremel device without incident. -Discussed tinea pedis  infection. To prevent re-infection of tinea pedis, patient/POA/caregiver instructed to spray shoes with Lysol every evening and clean tub/shower with bleach based cleanser. -For tinea pedis, Rx sent to pharmacy for Ketoconazole Cream 2% to be applied once daily for six weeks. -Patient/POA to call should there be question/concern in the interim.   Return in about 6 months (around 01/15/2024).  Freddie Breech, DPM

## 2023-10-14 ENCOUNTER — Ambulatory Visit (INDEPENDENT_AMBULATORY_CARE_PROVIDER_SITE_OTHER): Payer: Medicare Other | Admitting: Podiatry

## 2023-10-14 DIAGNOSIS — Z91199 Patient's noncompliance with other medical treatment and regimen due to unspecified reason: Secondary | ICD-10-CM

## 2023-10-14 NOTE — Progress Notes (Signed)
1. No-show for appointment     

## 2024-01-10 ENCOUNTER — Ambulatory Visit (INDEPENDENT_AMBULATORY_CARE_PROVIDER_SITE_OTHER): Payer: Medicare Other | Admitting: Podiatry

## 2024-01-10 DIAGNOSIS — Z91199 Patient's noncompliance with other medical treatment and regimen due to unspecified reason: Secondary | ICD-10-CM

## 2024-01-10 NOTE — Progress Notes (Signed)
 1. No-show for appointment

## 2024-03-13 ENCOUNTER — Ambulatory Visit (INDEPENDENT_AMBULATORY_CARE_PROVIDER_SITE_OTHER): Payer: Medicare Other | Admitting: Podiatry

## 2024-03-13 DIAGNOSIS — Z91199 Patient's noncompliance with other medical treatment and regimen due to unspecified reason: Secondary | ICD-10-CM

## 2024-03-13 NOTE — Progress Notes (Signed)
 1. No-show for appointment

## 2024-06-26 ENCOUNTER — Ambulatory Visit (INDEPENDENT_AMBULATORY_CARE_PROVIDER_SITE_OTHER): Admitting: Podiatry

## 2024-06-26 ENCOUNTER — Encounter: Payer: Self-pay | Admitting: Podiatry

## 2024-06-26 DIAGNOSIS — M79675 Pain in left toe(s): Secondary | ICD-10-CM

## 2024-06-26 DIAGNOSIS — M79674 Pain in right toe(s): Secondary | ICD-10-CM

## 2024-06-26 DIAGNOSIS — B351 Tinea unguium: Secondary | ICD-10-CM | POA: Diagnosis not present

## 2024-07-01 NOTE — Progress Notes (Signed)
  Subjective:  Patient ID: Chad Young, male    DOB: Apr 12, 1971,  MRN: [id number]  53 y.o. male presents painful mycotic toenails of both feet that are difficult to trim. Pain interferes with daily activities and wearing enclosed shoe gear comfortably.  Chief Complaint  Patient presents with   RFC    Rm2 Not diabetic/No PCP   New problem(s): None   Allergies  Allergen Reactions   Penicillins     Review of Systems: Negative except as noted in the HPI.   Objective:  Chad Young is a pleasant 53 y.o. male in NAD. AAO x 3.  Vascular Examination: Vascular status intact b/l with palpable pedal pulses. CFT immediate b/l. Pedal hair present. No edema. No pain with calf compression b/l. Skin temperature gradient WNL b/l. No varicosities noted. No cyanosis or clubbing noted.  Neurological Examination: Sensation grossly intact b/l with 10 gram monofilament. Vibratory sensation intact b/l.  Dermatological Examination: Pedal skin with normal turgor, texture and tone b/l. No open wounds nor interdigital macerations noted. Toenails 1-5 b/l thick, discolored, elongated with subungual debris and pain on dorsal palpation. No hyperkeratotic lesions noted b/l.   Musculoskeletal Examination: Muscle strength 5/5 to b/l LE.  No pain, crepitus noted b/l. No gross pedal deformities. Patient ambulates independently without assistive aids.   Radiographs: None  Last A1c:       No data to display           Assessment:   1. Pain due to onychomycosis of toenails of both feet    Plan:  Patient was evaluated and treated. All patient's and/or POA's questions/concerns addressed on today's visit. Toenails 1-5 debrided in length and girth without incident. Continue soft, supportive shoe gear daily. Report any pedal injuries to medical professional. Call office if there are any questions/concerns. -Patient/POA to call should there be question/concern in the interim.  Return in about 3 months  (around 09/26/2024).  Delon LITTIE Merlin, DPM      [place] LOCATION: [address] [place] [address]                   Office [phone]   [facility] LOCATION: [address] [place], [place] [address] Office [phone]

## 2024-08-26 DIAGNOSIS — Z0000 Encounter for general adult medical examination without abnormal findings: Secondary | ICD-10-CM | POA: Diagnosis not present

## 2024-09-10 NOTE — Progress Notes (Signed)
 KAYVAN HOEFLING                                          MRN: [id number]   09/10/2024   The [facility] Quality Team Specialist reviewed this patient medical record for the purposes of chart review for care gap closure. The following were reviewed: chart review for care gap closure-colorectal cancer screening.    [facility] Quality Team

## 2024-09-25 ENCOUNTER — Ambulatory Visit: Admitting: Podiatry

## 2024-09-25 DIAGNOSIS — Z91199 Patient's noncompliance with other medical treatment and regimen due to unspecified reason: Secondary | ICD-10-CM

## 2024-09-25 NOTE — Progress Notes (Signed)
 1. No-show for appointment

## 2025-02-11 ENCOUNTER — Ambulatory Visit: Admitting: Podiatry
